# Patient Record
Sex: Male | Born: 1985 | Race: White | Hispanic: No | Marital: Single | Smoking: Never smoker
Health system: Southern US, Community
[De-identification: ages and names within clinical notes are randomized; demographics above are authoritative.]

## PROBLEM LIST (undated history)

## (undated) DIAGNOSIS — A0472 Enterocolitis due to Clostridium difficile, not specified as recurrent: Secondary | ICD-10-CM

---
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
## 2011-07-21 ENCOUNTER — Encounter (HOSPITAL_COMMUNITY): Payer: Self-pay | Admitting: *Deleted

## 2011-07-21 ENCOUNTER — Emergency Department (HOSPITAL_COMMUNITY)
Admission: EM | Admit: 2011-07-21 | Discharge: 2011-07-21 | Disposition: A | Discharge status: Home / Self Care | Payer: Worker's Compensation | Attending: Emergency Medicine | Admitting: Emergency Medicine

## 2011-07-21 DIAGNOSIS — Y990 Civilian activity done for income or pay: Secondary | ICD-10-CM | POA: Insufficient documentation

## 2011-07-21 DIAGNOSIS — S61509A Unspecified open wound of unspecified wrist, initial encounter: Secondary | ICD-10-CM | POA: Insufficient documentation

## 2011-07-21 DIAGNOSIS — W268XXA Contact with other sharp object(s), not elsewhere classified, initial encounter: Secondary | ICD-10-CM | POA: Insufficient documentation

## 2011-07-21 DIAGNOSIS — IMO0002 Reserved for concepts with insufficient information to code with codable children: Secondary | ICD-10-CM

## 2011-07-21 NOTE — ED Provider Notes (Signed)
History     CSN: [id number]  Arrival date & time 07/21/11  1440   First MD Initiated Contact with Patient 07/21/11 1456      Chief Complaint  Patient presents with  . Extremity Laceration    HPI Patient was at work today when he accidentally lacerated his left wrist with a sharp blade. Patient had significant bleeding after the injury. He had a pressure dressing applied which helped with the bleeding. Patient was transported by ambulance. He denies numbness or weakness. He denies any other injuries. History reviewed. No pertinent past medical history.  History reviewed. No pertinent past surgical history.  History reviewed. No pertinent family history.  History  Substance Use Topics  . Smoking status: Never Smoker   . Smokeless tobacco: Not on file  . Alcohol Use: No      Review of Systems  All other systems reviewed and are negative.    Allergies  Aspirin  Home Medications  No current outpatient prescriptions on file.  BP 131/79  Pulse 110  Temp(Src) 98.5 F (36.9 C) (Oral)  Resp 20  SpO2 96%  Physical Exam  Nursing note and vitals reviewed. Constitutional: He appears well-developed and well-nourished. No distress.  HENT:  Head: Normocephalic and atraumatic.  Right Ear: External ear normal.  Left Ear: External ear normal.  Eyes: Conjunctivae are normal. Right eye exhibits no discharge. Left eye exhibits no discharge. No scleral icterus.  Neck: Neck supple. No tracheal deviation present.  Cardiovascular: Normal rate.   Pulmonary/Chest: Effort normal. No stridor. No respiratory distress.  Musculoskeletal: He exhibits no edema.       Left forearm: He exhibits laceration.       Approximately 1 inch laceration left dorsal wrist proximal to the ulnar styloid, strong radial pulse, distal cap refill brisk, sensation light touch intact throughout all digits  Neurological: He is alert. Cranial nerve deficit: no gross deficits.  Skin: Skin is warm and dry. No rash  noted.  Psychiatric: He has a normal mood and affect.    ED Course  LACERATION REPAIR Performed by: Celene Kras Authorized by: Linwood Dibbles R Consent: Verbal consent obtained. Written consent not obtained. Risks and benefits: risks, benefits and alternatives were discussed Consent given by: patient Body area: upper extremity Location details: left lower arm Laceration length: 2.5 cm Tendon involvement: none Nerve involvement: none Vascular damage: no Anesthesia: local infiltration Local anesthetic: lidocaine 1% with epinephrine Anesthetic total: 10 ml Patient sedated: no Irrigation solution: saline Irrigation method: jet lavage Amount of cleaning: standard Debridement: none Degree of undermining: none Skin closure: 4-0 Prolene Number of sutures: 6 Technique: simple Approximation: close Approximation difficulty: simple Dressing: pressure dressing Patient tolerance: Patient tolerated the procedure well with no immediate complications. Comments: No arterial bleeding was noted. Patient did develop hematoma following the procedure. A pressure dressing was applied. It was explored and there did not appear to be any arterial injury. I suspect he did have a small venule laceration   (including critical care time)  Labs Reviewed - No data to display No results found.    MDM  Simple laceration that was repaired in the emergency department. Patient tolerated procedure well        Celene Kras, MD 07/21/11 1536

## 2011-07-21 NOTE — ED Notes (Signed)
Pt cut left wrist with blade while at work. approx one inch long. Area was wrapped when ems got there, bystanders stated it was aterial bleed. Vitals wnl.

## 2011-07-21 NOTE — Discharge Instructions (Signed)
Laceration Care, Adult     A laceration is a cut or lesion that goes through all layers of the skin and into the tissue just beneath the skin.  TREATMENT   Some lacerations may not require closure. Some lacerations may not be able to be closed due to an increased risk of infection. It is important to see your caregiver as soon as possible after an injury to minimize the risk of infection and maximize the opportunity for successful closure.  If closure is appropriate, pain medicines may be given, if needed. The wound will be cleaned to help prevent infection. Your caregiver will use stitches (sutures), staples, wound glue (adhesive), or skin adhesive strips to repair the laceration. These tools bring the skin edges together to allow for faster healing and a better cosmetic outcome. However, all wounds will heal with a scar. Once the wound has healed, scarring can be minimized by covering the wound with sunscreen during the day for 1 full year.  HOME CARE INSTRUCTIONS   For sutures or staples:  · Keep the wound clean and dry.   · If you were given a bandage (dressing), you should change it at least once a day. Also, change the dressing if it becomes wet or dirty, or as directed by your caregiver.   · Wash the wound with soap and water 2 times a day. Rinse the wound off with water to remove all soap. Pat the wound dry with a clean towel.   · After cleaning, apply a thin layer of the antibiotic ointment as recommended by your caregiver. This will help prevent infection and keep the dressing from sticking.   · You may shower as usual after the first 24 hours. Do not soak the wound in water until the sutures are removed.   · Only take over-the-counter or prescription medicines for pain, discomfort, or fever as directed by your caregiver.   · Get your sutures or staples removed as directed by your caregiver.   For skin adhesive strips:  · Keep the wound clean and dry.   · Do not get the skin adhesive strips wet. You may  bathe carefully, using caution to keep the wound dry.   · If the wound gets wet, pat it dry with a clean towel.   · Skin adhesive strips will fall off on their own. You may trim the strips as the wound heals. Do not remove skin adhesive strips that are still stuck to the wound. They will fall off in time.   For wound adhesive:  · You may briefly wet your wound in the shower or bath. Do not soak or scrub the wound. Do not swim. Avoid periods of heavy perspiration until the skin adhesive has fallen off on its own. After showering or bathing, gently pat the wound dry with a clean towel.   · Do not apply liquid medicine, cream medicine, or ointment medicine to your wound while the skin adhesive is in place. This may loosen the film before your wound is healed.   · If a dressing is placed over the wound, be careful not to apply tape directly over the skin adhesive. This may cause the adhesive to be pulled off before the wound is healed.   · Avoid prolonged exposure to sunlight or tanning lamps while the skin adhesive is in place. Exposure to ultraviolet light in the first year will darken the scar.   · The skin adhesive will usually remain in place for   5 to 10 days, then naturally fall off the skin. Do not pick at the adhesive film.   You may need a tetanus shot if:  · You cannot remember when you had your last tetanus shot.   · You have never had a tetanus shot.   If you get a tetanus shot, your arm may swell, get red, and feel warm to the touch. This is common and not a problem. If you need a tetanus shot and you choose not to have one, there is a rare chance of getting tetanus. Sickness from tetanus can be serious.  SEEK MEDICAL CARE IF:   · You have redness, swelling, or increasing pain in the wound.   · You see a red line that goes away from the wound.   · You have yellowish-white fluid (pus) coming from the wound.   · You have a fever.   · You notice a bad smell coming from the wound or dressing.   · Your wound  breaks open before or after sutures have been removed.   · You notice something coming out of the wound such as wood or glass.   · Your wound is on your hand or foot and you cannot move a finger or toe.   SEEK IMMEDIATE MEDICAL CARE IF:   · Your pain is not controlled with prescribed medicine.   · You have severe swelling around the wound causing pain and numbness or a change in color in your arm, hand, leg, or foot.   · Your wound splits open and starts bleeding.   · You have worsening numbness, weakness, or loss of function of any joint around or beyond the wound.   · You develop painful lumps near the wound or on the skin anywhere on your body.   MAKE SURE YOU:   · Understand these instructions.   · Will watch your condition.   · Will get help right away if you are not doing well or get worse.   Document Released: 05/07/2005 Document Revised: 01/17/2011 Document Reviewed: 10/31/2010  [employer]® Patient Information ©2012 [employer].

## 2012-03-09 ENCOUNTER — Emergency Department (HOSPITAL_COMMUNITY)
Admission: EM | Admit: 2012-03-09 | Discharge: 2012-03-09 | Disposition: A | Discharge status: Home / Self Care | Payer: 59 | Attending: Emergency Medicine | Admitting: Emergency Medicine

## 2012-03-09 ENCOUNTER — Encounter (HOSPITAL_COMMUNITY): Payer: Self-pay | Admitting: Emergency Medicine

## 2012-03-09 DIAGNOSIS — L03221 Cellulitis of neck: Secondary | ICD-10-CM

## 2012-03-09 DIAGNOSIS — L0211 Cutaneous abscess of neck: Secondary | ICD-10-CM | POA: Insufficient documentation

## 2012-03-09 MED ORDER — DOXYCYCLINE HYCLATE 100 MG PO TABS
200.0000 mg | ORAL_TABLET | Freq: Once | ORAL | Status: AC
Start: 2012-03-09 — End: 2012-03-09
  Administered 2012-03-09: 200 mg via ORAL
  Filled 2012-03-09: qty 2

## 2012-03-09 MED ORDER — DOXYCYCLINE HYCLATE 100 MG PO CAPS: 100.0000 mg | ORAL_CAPSULE | Freq: Two times a day (BID) | ORAL | Status: DC

## 2012-03-09 NOTE — ED Notes (Signed)
Patient c/o abscess to right side of neck.  Patient states noticed a few days ago that it looked like a pimple; today it began getting bigger.

## 2012-03-09 NOTE — ED Provider Notes (Signed)
History     CSN: [id number]  Arrival date & time 03/09/12  2105   First MD Initiated Contact with Patient 03/09/12 2113      Chief Complaint  Patient presents with  . Abscess    (Consider location/radiation/quality/duration/timing/severity/associated sxs/prior treatment) HPI Comments: States he had an ingrown hair that he picked at with a needle and then squeezed.  It is now red and swollen and was draining yest.  "now when i squeeze it i just get a clear yellowish liquid out of it".  Patient is a 26 y.o. male presenting with abscess. The history is provided by the patient. No language interpreter was used.  Abscess  This is a new problem. Episode onset: 2-3 days ago. The problem has been gradually worsening. The abscess is present on the neck. The problem is moderate. The abscess is characterized by redness and painfulness. Pertinent negatives include no fever. There were no sick contacts. He has received no recent medical care.    History reviewed. No pertinent past medical history.  History reviewed. No pertinent past surgical history.  No family history on file.  History  Substance Use Topics  . Smoking status: Never Smoker   . Smokeless tobacco: Not on file  . Alcohol Use: No      Review of Systems  Constitutional: Negative for fever and chills.  Skin:       Abscess   Neurological: Negative for weakness and numbness.  All other systems reviewed and are negative.    Allergies  Aspirin  Home Medications   Current Outpatient Rx  Name Route Sig Dispense Refill  . DOXYCYCLINE HYCLATE 100 MG PO CAPS Oral Take 1 capsule (100 mg total) by mouth 2 (two) times daily. 20 capsule 0    BP 131/70  Pulse 116  Temp 100.4 F (38 C) (Oral)  Resp 18  Ht 5\' 10"  (1.778 m)  Wt 198 lb (89.812 kg)  BMI 28.41 kg/m2  SpO2 99%  Physical Exam  Nursing note and vitals reviewed. Constitutional: He is oriented to person, place, and time. He appears well-developed and  well-nourished.  HENT:  Head: Normocephalic and atraumatic.  Eyes: EOM are normal.  Neck: Trachea normal. Muscular tenderness present. No spinous process tenderness present. Erythema and decreased range of motion present.    Cardiovascular: Normal rate, regular rhythm and intact distal pulses.   Pulmonary/Chest: Effort normal. No respiratory distress.  Abdominal: Soft. He exhibits no distension. There is no tenderness.  Neurological: He is alert and oriented to person, place, and time.  Skin: Skin is warm and dry.  Psychiatric: He has a normal mood and affect. Judgment normal.    ED Course  Procedures (including critical care time)  Labs Reviewed - No data to display No results found.   1. Cellulitis and abscess of neck       MDM  rx-doxycycline, 20        Evalina Field, [place] 03/15/12 [phone]

## 2012-03-17 NOTE — ED Provider Notes (Signed)
Medical screening examination/treatment/procedure(s) were performed by non-physician practitioner and as supervising physician I was immediately available for consultation/collaboration.   Carleene Cooper III, MD 03/17/12 [phone]

## 2015-09-03 ENCOUNTER — Emergency Department (HOSPITAL_COMMUNITY): Payer: Commercial Managed Care - PPO | Admitting: Anesthesiology

## 2015-09-03 ENCOUNTER — Emergency Department (HOSPITAL_COMMUNITY): Payer: Commercial Managed Care - PPO

## 2015-09-03 ENCOUNTER — Observation Stay (HOSPITAL_COMMUNITY)
Admission: EM | Admit: 2015-09-03 | Discharge: 2015-09-04 | Disposition: A | Discharge status: Home / Self Care | Payer: Commercial Managed Care - PPO | Attending: General Surgery | Admitting: General Surgery

## 2015-09-03 ENCOUNTER — Encounter (HOSPITAL_COMMUNITY)
Admission: EM | Disposition: A | Discharge status: Home / Self Care | Payer: Self-pay | Source: Home / Self Care | Attending: Emergency Medicine

## 2015-09-03 ENCOUNTER — Encounter (HOSPITAL_COMMUNITY): Payer: Self-pay | Admitting: Emergency Medicine

## 2015-09-03 DIAGNOSIS — K3580 Unspecified acute appendicitis: Secondary | ICD-10-CM | POA: Diagnosis not present

## 2015-09-03 DIAGNOSIS — K37 Unspecified appendicitis: Secondary | ICD-10-CM | POA: Diagnosis present

## 2015-09-03 HISTORY — PX: LAPAROSCOPIC APPENDECTOMY: SHX408

## 2015-09-03 LAB — COMPREHENSIVE METABOLIC PANEL
ALBUMIN: 4.4 g/dL (ref 3.5–5.0)
ALK PHOS: 93 U/L (ref 38–126)
ALT: 75 U/L — ABNORMAL HIGH (ref 17–63)
ANION GAP: 11 (ref 5–15)
AST: 53 U/L — AB (ref 15–41)
BUN: 12 mg/dL (ref 6–20)
CO2: 22 mmol/L (ref 22–32)
Calcium: 9.3 mg/dL (ref 8.9–10.3)
Chloride: 101 mmol/L (ref 101–111)
Creatinine, Ser: 0.89 mg/dL (ref 0.61–1.24)
GFR calc Af Amer: >60 mL/min (ref >60)
GFR calc non Af Amer: >60 mL/min (ref >60)
GLUCOSE: 122 mg/dL — AB (ref 65–99)
POTASSIUM: 4.6 mmol/L (ref 3.5–5.1)
SODIUM: 134 mmol/L — AB (ref 135–145)
Total Bilirubin: 1.7 mg/dL — ABNORMAL HIGH (ref 0.3–1.2)
Total Protein: 8.1 g/dL (ref 6.5–8.1)

## 2015-09-03 LAB — CBC
HEMATOCRIT: 45.6 % (ref 39.0–52.0)
HEMOGLOBIN: 15.7 g/dL (ref 13.0–17.0)
MCH: 30.3 pg (ref 26.0–34.0)
MCHC: 34.4 g/dL (ref 30.0–36.0)
MCV: 88 fL (ref 78.0–100.0)
Platelets: 334 10*3/uL (ref 150–400)
RBC: 5.18 MIL/uL (ref 4.22–5.81)
RDW: 11.5 % (ref 11.5–15.5)
WBC: 17.5 10*3/uL — ABNORMAL HIGH (ref 4.0–10.5)

## 2015-09-03 LAB — URINALYSIS, ROUTINE W REFLEX MICROSCOPIC
Bilirubin Urine: NEGATIVE
Glucose, UA: NEGATIVE mg/dL
Hgb urine dipstick: NEGATIVE
Ketones, ur: NEGATIVE mg/dL
Leukocytes, UA: NEGATIVE
Nitrite: NEGATIVE
PH: 8 (ref 5.0–8.0)
Protein, ur: 30 mg/dL — AB
SPECIFIC GRAVITY, URINE: 1.027 (ref 1.005–1.030)

## 2015-09-03 LAB — URINE MICROSCOPIC-ADD ON

## 2015-09-03 SURGERY — APPENDECTOMY, LAPAROSCOPIC
Anesthesia: General | Site: Abdomen

## 2015-09-03 MED ORDER — SUGAMMADEX SODIUM 200 MG/2ML IV SOLN
INTRAVENOUS | Status: AC
  Filled 2015-09-03: qty 2

## 2015-09-03 MED ORDER — SODIUM CHLORIDE 0.9 % IV SOLN: INTRAVENOUS | Status: DC

## 2015-09-03 MED ORDER — IOPAMIDOL (ISOVUE-300) INJECTION 61%
INTRAVENOUS | Status: AC
  Administered 2015-09-03: 100 mL
  Filled 2015-09-03: qty 100

## 2015-09-03 MED ORDER — SUCCINYLCHOLINE CHLORIDE 20 MG/ML IJ SOLN
INTRAMUSCULAR | Status: DC | PRN
  Administered 2015-09-03: 160 mg via INTRAVENOUS

## 2015-09-03 MED ORDER — PROPOFOL 10 MG/ML IV BOLUS
INTRAVENOUS | Status: AC
  Filled 2015-09-03: qty 20

## 2015-09-03 MED ORDER — SUGAMMADEX SODIUM 200 MG/2ML IV SOLN
INTRAVENOUS | Status: DC | PRN
  Administered 2015-09-03: 200 mg via INTRAVENOUS

## 2015-09-03 MED ORDER — HYDROMORPHONE HCL 1 MG/ML IJ SOLN
0.2500 mg | INTRAMUSCULAR | Status: DC | PRN
  Administered 2015-09-03 (×2): 0.5 mg via INTRAVENOUS

## 2015-09-03 MED ORDER — PROPOFOL 10 MG/ML IV BOLUS
INTRAVENOUS | Status: DC | PRN
  Administered 2015-09-03: 200 mg via INTRAVENOUS

## 2015-09-03 MED ORDER — FENTANYL CITRATE (PF) 100 MCG/2ML IJ SOLN
INTRAMUSCULAR | Status: DC | PRN
  Administered 2015-09-03: 50 ug via INTRAVENOUS
  Administered 2015-09-03: 150 ug via INTRAVENOUS
  Administered 2015-09-03: 50 ug via INTRAVENOUS

## 2015-09-03 MED ORDER — MIDAZOLAM HCL 2 MG/2ML IJ SOLN: 0.5000 mg | Freq: Once | INTRAMUSCULAR | Status: DC | PRN

## 2015-09-03 MED ORDER — LACTATED RINGERS IV SOLN
INTRAVENOUS | Status: DC | PRN
  Administered 2015-09-03 (×2): via INTRAVENOUS

## 2015-09-03 MED ORDER — FENTANYL CITRATE (PF) 250 MCG/5ML IJ SOLN
INTRAMUSCULAR | Status: AC
  Filled 2015-09-03: qty 5

## 2015-09-03 MED ORDER — ONDANSETRON HCL 4 MG/2ML IJ SOLN
INTRAMUSCULAR | Status: AC
  Filled 2015-09-03: qty 2

## 2015-09-03 MED ORDER — METRONIDAZOLE IN NACL 5-0.79 MG/ML-% IV SOLN
500.0000 mg | Freq: Three times a day (TID) | INTRAVENOUS | Status: DC
  Administered 2015-09-04: 500 mg via INTRAVENOUS
  Filled 2015-09-03 (×3): qty 100

## 2015-09-03 MED ORDER — DEXTROSE 5 % IV SOLN
2.0000 g | INTRAVENOUS | Status: DC
  Filled 2015-09-03: qty 2

## 2015-09-03 MED ORDER — SODIUM CHLORIDE 0.9 % IV BOLUS (SEPSIS)
500.0000 mL | Freq: Once | INTRAVENOUS | Status: AC
  Administered 2015-09-03: 500 mL via INTRAVENOUS

## 2015-09-03 MED ORDER — DEXTROSE 5 % IV SOLN
2.0000 g | Freq: Once | INTRAVENOUS | Status: AC
  Administered 2015-09-03: 2 g via INTRAVENOUS
  Filled 2015-09-03: qty 2

## 2015-09-03 MED ORDER — ONDANSETRON HCL 4 MG/2ML IJ SOLN
INTRAMUSCULAR | Status: DC | PRN
  Administered 2015-09-03: 4 mg via INTRAVENOUS

## 2015-09-03 MED ORDER — ROCURONIUM BROMIDE 100 MG/10ML IV SOLN
INTRAVENOUS | Status: DC | PRN
  Administered 2015-09-03: 20 mg via INTRAVENOUS
  Administered 2015-09-03: 30 mg via INTRAVENOUS

## 2015-09-03 MED ORDER — MIDAZOLAM HCL 5 MG/5ML IJ SOLN
INTRAMUSCULAR | Status: DC | PRN
  Administered 2015-09-03: 2 mg via INTRAVENOUS

## 2015-09-03 MED ORDER — HYDROMORPHONE HCL 1 MG/ML IJ SOLN
INTRAMUSCULAR | Status: AC
  Filled 2015-09-03: qty 1

## 2015-09-03 MED ORDER — METRONIDAZOLE IN NACL 5-0.79 MG/ML-% IV SOLN
500.0000 mg | Freq: Once | INTRAVENOUS | Status: AC
  Administered 2015-09-03: 500 mg via INTRAVENOUS
  Filled 2015-09-03: qty 100

## 2015-09-03 MED ORDER — MEPERIDINE HCL 25 MG/ML IJ SOLN: 6.2500 mg | INTRAMUSCULAR | Status: DC | PRN

## 2015-09-03 MED ORDER — SODIUM CHLORIDE 0.9 % IR SOLN
Status: DC | PRN
  Administered 2015-09-03: 1000 mL

## 2015-09-03 MED ORDER — BUPIVACAINE-EPINEPHRINE (PF) 0.25% -1:200000 IJ SOLN
INTRAMUSCULAR | Status: AC
  Filled 2015-09-03: qty 30

## 2015-09-03 MED ORDER — ENOXAPARIN SODIUM 40 MG/0.4ML ~~LOC~~ SOLN: 40.0000 mg | SUBCUTANEOUS | Status: DC

## 2015-09-03 MED ORDER — MIDAZOLAM HCL 2 MG/2ML IJ SOLN
INTRAMUSCULAR | Status: AC
  Filled 2015-09-03: qty 2

## 2015-09-03 MED ORDER — LIDOCAINE HCL (CARDIAC) 20 MG/ML IV SOLN
INTRAVENOUS | Status: DC | PRN
  Administered 2015-09-03: 30 mg via INTRAVENOUS

## 2015-09-03 MED ORDER — BUPIVACAINE-EPINEPHRINE 0.25% -1:200000 IJ SOLN
INTRAMUSCULAR | Status: DC | PRN
  Administered 2015-09-03: 10.5 mL

## 2015-09-03 MED ORDER — HYDROMORPHONE HCL 1 MG/ML IJ SOLN
INTRAMUSCULAR | Status: DC | PRN
  Administered 2015-09-03: 1 mg via INTRAVENOUS

## 2015-09-03 SURGICAL SUPPLY — 44 items
ADH SKN CLS LQ APL DERMABOND (GAUZE/BANDAGES/DRESSINGS) ×1
APPLIER CLIP ROT 10 11.4 M/L (STAPLE)
APR CLP MED LRG 11.4X10 (STAPLE)
BAG SPEC RTRVL 10 TROC 200 (ENDOMECHANICALS) ×1
CANISTER SUCTION 2500CC (MISCELLANEOUS) ×3 IMPLANT
CHLORAPREP W/TINT 26ML (MISCELLANEOUS) ×3 IMPLANT
CLIP APPLIE ROT 10 11.4 M/L (STAPLE) IMPLANT
CLOSURE WOUND 1/2 X4 (GAUZE/BANDAGES/DRESSINGS) ×1
COVER SURGICAL LIGHT HANDLE (MISCELLANEOUS) ×3 IMPLANT
CUTTER FLEX LINEAR 45M (STAPLE) ×3 IMPLANT
DERMABOND ADHESIVE PROPEN (GAUZE/BANDAGES/DRESSINGS) ×2
DERMABOND ADVANCED .7 DNX6 (GAUZE/BANDAGES/DRESSINGS) IMPLANT
DEVICE TROCAR PUNCTURE CLOSURE (ENDOMECHANICALS) ×3 IMPLANT
ELECT REM PT RETURN 9FT ADLT (ELECTROSURGICAL) ×3
ELECTRODE REM PT RTRN 9FT ADLT (ELECTROSURGICAL) ×1 IMPLANT
GLOVE BIO SURGEON STRL SZ7 (GLOVE) ×3 IMPLANT
GLOVE BIO SURGEON STRL SZ8 (GLOVE) ×2 IMPLANT
GLOVE BIOGEL PI IND STRL 7.5 (GLOVE) ×1 IMPLANT
GLOVE BIOGEL PI INDICATOR 7.5 (GLOVE) ×2
GOWN STRL REUS W/ TWL LRG LVL3 (GOWN DISPOSABLE) ×3 IMPLANT
GOWN STRL REUS W/TWL LRG LVL3 (GOWN DISPOSABLE) ×9
KIT BASIN OR (CUSTOM PROCEDURE TRAY) ×3 IMPLANT
KIT ROOM TURNOVER OR (KITS) ×3 IMPLANT
LIQUID BAND (GAUZE/BANDAGES/DRESSINGS) ×3 IMPLANT
NS IRRIG 1000ML POUR BTL (IV SOLUTION) ×3 IMPLANT
PAD ARMBOARD 7.5X6 YLW CONV (MISCELLANEOUS) ×6 IMPLANT
POUCH RETRIEVAL ECOSAC 10 (ENDOMECHANICALS) ×1 IMPLANT
POUCH RETRIEVAL ECOSAC 10MM (ENDOMECHANICALS) ×2
RELOAD STAPLE TA45 3.5 REG BLU (ENDOMECHANICALS) ×6 IMPLANT
SCALPEL HARMONIC ACE (MISCELLANEOUS) ×3 IMPLANT
SCISSORS LAP 5X35 DISP (ENDOMECHANICALS) IMPLANT
SET IRRIG TUBING LAPAROSCOPIC (IRRIGATION / IRRIGATOR) ×3 IMPLANT
SLEEVE ENDOPATH XCEL 5M (ENDOMECHANICALS) ×3 IMPLANT
SPECIMEN JAR SMALL (MISCELLANEOUS) ×3 IMPLANT
STRIP CLOSURE SKIN 1/2X4 (GAUZE/BANDAGES/DRESSINGS) ×2 IMPLANT
SUT MNCRL AB 4-0 PS2 18 (SUTURE) ×3 IMPLANT
SUT VICRYL 0 UR6 27IN ABS (SUTURE) ×5 IMPLANT
TOWEL OR 17X24 6PK STRL BLUE (TOWEL DISPOSABLE) ×3 IMPLANT
TOWEL OR 17X26 10 PK STRL BLUE (TOWEL DISPOSABLE) ×3 IMPLANT
TRAY FOLEY CATH 16FR SILVER (SET/KITS/TRAYS/PACK) ×3 IMPLANT
TRAY LAPAROSCOPIC MC (CUSTOM PROCEDURE TRAY) ×3 IMPLANT
TROCAR XCEL BLUNT TIP 100MML (ENDOMECHANICALS) ×3 IMPLANT
TROCAR XCEL NON-BLD 5MMX100MML (ENDOMECHANICALS) ×3 IMPLANT
TUBING INSUFFLATION (TUBING) ×3 IMPLANT

## 2015-09-03 NOTE — ED Notes (Addendum)
Pt reports lower R sided abdominal pain radiating downward, accompanied by nausea, starting this morning. Pt states pain in worse with movement, at rest it is better. Pt denies urinary symptoms.

## 2015-09-03 NOTE — Transfer of Care (Signed)
Immediate Anesthesia Transfer of Care Note  Patient: Jesse Swanson  Procedure(s) Performed: Procedure(s): APPENDECTOMY LAPAROSCOPIC (N/A)  Patient Location: PACU  Anesthesia Type:General  Level of Consciousness: awake, alert  and oriented  Airway & Oxygen Therapy: Patient Spontanous Breathing  Post-op Assessment: Report given to RN and Post -op Vital signs reviewed and stable  Post vital signs: Reviewed and stable  Last Vitals:  Filed Vitals:   09/03/15 1954 09/03/15 2137  BP: 130/82 124/58  Pulse: 86 93  Temp: 36.8 C 37.1 C  Resp:  16    Complications: No apparent anesthesia complications

## 2015-09-03 NOTE — ED Notes (Signed)
Pt states he started having right lower abd pain this morning. Pt states he is nauseous but has not vomited. Denies urinary symptoms. Still has appendix.

## 2015-09-03 NOTE — H&P (Signed)
Jesse Swanson is an 30 y.o. male.   Chief Complaint: rlq pain HPI: 11 yom who presents with less than 24 hours of rlq pain, getting worse not going away. Some nausea, no emesis, had bm today, not eating much.  No fevers.  Underwent eval and ct scan that appears to be appendicitis.  History reviewed. No pertinent past medical history.  History reviewed. No pertinent past surgical history.  History reviewed. No pertinent family history. Social History:  reports that he has never smoked. He does not have any smokeless tobacco history on file. He reports that he drinks alcohol. He reports that he does not use illicit drugs.  Allergies:  Allergies  Allergen Reactions  . Aspirin Swelling    meds none  Results for orders placed or performed during the hospital encounter of 09/03/15 (from the past 48 hour(s))  Comprehensive metabolic panel     Status: Abnormal   Collection Time: 09/03/15  4:42 PM  Result Value Ref Range   Sodium 134 (L) 135 - 145 mmol/L   Potassium 4.6 3.5 - 5.1 mmol/L   Chloride 101 101 - 111 mmol/L   CO2 22 22 - 32 mmol/L   Glucose, Bld 122 (H) 65 - 99 mg/dL   BUN 12 6 - 20 mg/dL   Creatinine, Ser 0.89 0.61 - 1.24 mg/dL   Calcium 9.3 8.9 - 10.3 mg/dL   Total Protein 8.1 6.5 - 8.1 g/dL   Albumin 4.4 3.5 - 5.0 g/dL   AST 53 (H) 15 - 41 U/L   ALT 75 (H) 17 - 63 U/L   Alkaline Phosphatase 93 38 - 126 U/L   Total Bilirubin 1.7 (H) 0.3 - 1.2 mg/dL   GFR calc non Af Amer >60 >60 mL/min   GFR calc Af Amer >60 >60 mL/min    Comment: (NOTE) The eGFR has been calculated using the CKD EPI equation. This calculation has not been validated in all clinical situations. eGFR's persistently <60 mL/min signify possible Chronic Kidney Disease.    Anion gap 11 5 - 15  CBC     Status: Abnormal   Collection Time: 09/03/15  4:42 PM  Result Value Ref Range   WBC 17.5 (H) 4.0 - 10.5 K/uL   RBC 5.18 4.22 - 5.81 MIL/uL   Hemoglobin 15.7 13.0 - 17.0 g/dL   HCT 45.6 39.0 - 52.0 %    MCV 88.0 78.0 - 100.0 fL   MCH 30.3 26.0 - 34.0 pg   MCHC 34.4 30.0 - 36.0 g/dL   RDW 11.5 11.5 - 15.5 %   Platelets 334 150 - 400 K/uL  Urinalysis, Routine w reflex microscopic (not at [facility])     Status: Abnormal   Collection Time: 09/03/15  5:32 PM  Result Value Ref Range   Color, Urine YELLOW YELLOW   APPearance TURBID (A) CLEAR   Specific Gravity, Urine 1.027 1.005 - 1.030   pH 8.0 5.0 - 8.0   Glucose, UA NEGATIVE NEGATIVE mg/dL   Hgb urine dipstick NEGATIVE NEGATIVE   Bilirubin Urine NEGATIVE NEGATIVE   Ketones, ur NEGATIVE NEGATIVE mg/dL   Protein, ur 30 (A) NEGATIVE mg/dL   Nitrite NEGATIVE NEGATIVE   Leukocytes, UA NEGATIVE NEGATIVE  Urine microscopic-add on     Status: Abnormal   Collection Time: 09/03/15  5:32 PM  Result Value Ref Range   Squamous Epithelial / LPF 0-5 (A) NONE SEEN   WBC, UA 0-5 0 - 5 WBC/hpf   RBC / HPF 0-5 0 -  5 RBC/hpf   Bacteria, UA RARE (A) NONE SEEN   Urine-Other AMORPHOUS URATES/PHOSPHATES    Ct Abdomen Pelvis W Contrast  09/03/2015  CLINICAL DATA:  Patient with right lower quadrant abdominal pain and nausea. EXAM: CT ABDOMEN AND PELVIS WITH CONTRAST TECHNIQUE: Multidetector CT imaging of the abdomen and pelvis was performed using the standard protocol following bolus administration of intravenous contrast. CONTRAST:  189m ISOVUE-300 IOPAMIDOL (ISOVUE-300) INJECTION 61% COMPARISON:  None. FINDINGS: Lower chest: Heart is normal in size. No consolidative pulmonary opacities. No pleural effusion. Hepatobiliary: Liver is normal in size and contour. No focal hepatic lesions identified. Gallbladder is unremarkable. No intrahepatic or extrahepatic biliary ductal dilatation. Pancreas: Unremarkable Spleen: Unremarkable Adrenals/Urinary Tract: The adrenal glands are normal. Kidneys enhance symmetrically with contrast. No hydronephrosis. Urinary bladder is unremarkable. Stomach/Bowel: The appendix is dilated measuring up to 14 mm (image 90; series 5). There is  periappendiceal fat stranding. No evidence for perforation or surrounding abscess formation. No free fluid or free intraperitoneal air. No evidence for bowel obstruction. Normal morphology to the stomach. Vascular/Lymphatic: Normal caliber abdominal aorta. No retroperitoneal adenopathy. Other: Prostate unremarkable. Fat within the bilateral inguinal canals. Musculoskeletal: No aggressive or acute appearing osseous lesions. IMPRESSION: Findings compatible with acute appendicitis. No evidence for perforation or abscess formation. Electronically Signed   By: DLovey NewcomerM.D.   On: 09/03/2015 19:17    Review of Systems  Constitutional: Negative for fever and chills.  Respiratory: Negative for cough.   Cardiovascular: Negative for chest pain.  Gastrointestinal: Positive for nausea and abdominal pain. Negative for vomiting, diarrhea and constipation.  Genitourinary: Negative for dysuria, urgency and frequency.    Blood pressure 130/82, pulse 86, temperature 98.2 F (36.8 C), temperature source Oral, resp. rate 15, height '5\' 9"'$  (1.753 m), weight 114.896 kg (253 lb 4.8 oz), SpO2 99 %. Physical Exam  Vitals reviewed. Constitutional: He appears well-developed and well-nourished.  Eyes: No scleral icterus.  Neck: Neck supple.  Cardiovascular: Normal rate, regular rhythm and normal heart sounds.   Respiratory: Effort normal and breath sounds normal. He has no wheezes. He has no rales.  GI: Soft. Bowel sounds are normal. He exhibits no distension. There is tenderness in the right lower quadrant.     Assessment/Plan Appendicitis Abx, lap appy tonight, risks and procedure discussed with patient  WRolm Bookbinder MD 09/03/2015, 9:22 PM

## 2015-09-03 NOTE — Op Note (Signed)
Preoperative diagnosis: acute appendicitis Postoperative diagnosis: same as above Procedure: laparoscopic appendectomy Surgeon: Dr Harden MoMatt Tanyon Alipio Anesthesia: general EBL: minimal Drains none Specimen appendix to pathology Complications: none Sponge count correct at completion Disposition to recovery stable  Indications: This is a [age] yom with acute appendicitis. We discussed laparoscopic appendectomy.   Procedure: After informed consent was obtained the patient was taken to the operating room. He was already given antibiotics. Sequential compression devices were on his legs. He was placed under general anesthesia without complication. His abdomen was prepped and draped in the standard sterile surgical fashion. A surgical timeout was then performed. A foley catheter was placed.   I infiltrated marcaine below the umbilicus. I made an incision and then entered the fascia sharply. I then entered the peritoneum bluntly. I placed a 0 vicryl pursestring suture and inserted a hasson trocar.I then inserted 2 further 5 mm trocars in the suprapubic region and the left mid abdomen. He did appear to have acute appendicitis. I saw the TI into the right colon. I then dissected it free from the cecum. I divided the mesoappendix with the harmonic scalpel. I then divided the appendix with the gia stapler with two loads at the cecum due to inflammation. I then placed this in a bag and removed it from the abdomen.  I then obtained hemostasis and irrigated. I then removed the umbilical trocar and tied down my pursetring. I placed another 2 0 vicryl sutures with the endoclose device to obliterate this defect.  I then desufflated the abdomen and removed all my remaining trocars. I then closed these with 4-0 Monocryl and Dermabond. He tolerated this well was extubated and transferred to the recovery room in stable condition

## 2015-09-03 NOTE — ED Provider Notes (Signed)
CSN: [id number]     Arrival date & time 09/03/15  1602 History   First MD Initiated Contact with Patient 09/03/15 1659     Chief Complaint  Patient presents with  . Abdominal Pain     (Consider location/radiation/quality/duration/timing/severity/associated sxs/prior Treatment) The history is provided by the patient.     Patient presents with RLQ abdominal pain, nausea, decreased appetite that began this morning.  The pain is constant, worse with movement or lying.  He thought he might be constipated, last BM 2 days ago, took laxative this morning and had loose bowel movement. Denies fevers, chills, myalgias, vomiting, urinary symptoms, penile discharge, testicular pain or swelling.  No prior abdominal surgeries.  Last ate food around 11am, has been drinking gatorade in the room - has had approximately 10oz.    History reviewed. No pertinent past medical history. History reviewed. No pertinent past surgical history. History reviewed. No pertinent family history. Social History  Substance Use Topics  . Smoking status: Never Smoker   . Smokeless tobacco: None  . Alcohol Use: Yes    Review of Systems  All other systems reviewed and are negative.     Allergies  Aspirin  Home Medications   Prior to Admission medications   Medication Sig Start Date End Date Taking? Authorizing Provider  doxycycline (VIBRAMYCIN) 100 MG capsule Take 1 capsule (100 mg total) by mouth 2 (two) times daily. 03/09/12   Richard Paul Half, PA-C   BP 136/89 mmHg  Temp(Src) 98.1 F (36.7 C) (Oral)  Resp 15  Ht  (1.753 m)  Wt 114.896 kg  BMI 37.39 kg/m2  SpO2 97% Physical Exam  Constitutional: He appears well-developed and well-nourished. No distress.  HENT:  Head: Normocephalic and atraumatic.  Neck: Neck supple.  Cardiovascular: Normal rate and regular rhythm.   Pulmonary/Chest: Effort normal and breath sounds normal. No respiratory distress. He has no wheezes. He has no rales.  Abdominal:  Soft. Bowel sounds are normal. He exhibits no distension and no mass. There is tenderness in the right lower quadrant. There is tenderness at McBurney's point. There is no rebound, no guarding and no CVA tenderness.  Neurological: He is alert. He exhibits normal muscle tone.  Skin: He is not diaphoretic.  Nursing note and vitals reviewed.   ED Course  Procedures (including critical care time) Labs Review Labs Reviewed  COMPREHENSIVE METABOLIC PANEL - Abnormal; Notable for the following:    Sodium 134 (*)    Glucose, Bld 122 (*)    AST 53 (*)    ALT 75 (*)    Total Bilirubin 1.7 (*)    All other components within normal limits  CBC - Abnormal; Notable for the following:    WBC 17.5 (*)    All other components within normal limits  URINALYSIS, ROUTINE W REFLEX MICROSCOPIC (NOT AT [facility]) - Abnormal; Notable for the following:    APPearance TURBID (*)    Protein, ur 30 (*)    All other components within normal limits  URINE MICROSCOPIC-ADD ON - Abnormal; Notable for the following:    Squamous Epithelial / LPF 0-5 (*)    Bacteria, UA RARE (*)    All other components within normal limits    Imaging Review Ct Abdomen Pelvis W Contrast  09/03/2015  CLINICAL DATA:  Patient with right lower quadrant abdominal pain and nausea. EXAM: CT ABDOMEN AND PELVIS WITH CONTRAST TECHNIQUE: Multidetector CT imaging of the abdomen and pelvis was performed using the standard protocol following bolus  administration of intravenous contrast. CONTRAST:  100mL ISOVUE-300 IOPAMIDOL (ISOVUE-300) INJECTION 61% COMPARISON:  None. FINDINGS: Lower chest: Heart is normal in size. No consolidative pulmonary opacities. No pleural effusion. Hepatobiliary: Liver is normal in size and contour. No focal hepatic lesions identified. Gallbladder is unremarkable. No intrahepatic or extrahepatic biliary ductal dilatation. Pancreas: Unremarkable Spleen: Unremarkable Adrenals/Urinary Tract: The adrenal glands are normal. Kidneys  enhance symmetrically with contrast. No hydronephrosis. Urinary bladder is unremarkable. Stomach/Bowel: The appendix is dilated measuring up to 14 mm (image 90; series 5). There is periappendiceal fat stranding. No evidence for perforation or surrounding abscess formation. No free fluid or free intraperitoneal air. No evidence for bowel obstruction. Normal morphology to the stomach. Vascular/Lymphatic: Normal caliber abdominal aorta. No retroperitoneal adenopathy. Other: Prostate unremarkable. Fat within the bilateral inguinal canals. Musculoskeletal: No aggressive or acute appearing osseous lesions. IMPRESSION: Findings compatible with acute appendicitis. No evidence for perforation or abscess formation. Electronically Signed   By: Annia Beltrew  Davis M.D.   On: 09/03/2015 19:17   I have personally reviewed and evaluated these images and lab results as part of my medical decision-making.   EKG Interpretation None      MDM   Final diagnoses:  Acute appendicitis, unspecified acute appendicitis type    Afebrile nontoxic patient with focal RLQ abdominal pain with decreased appetite, nausea that began this morning.  Labs significant for leukocytosis (17.5), slight elevation in LFTs, hyperglycemia.  CT abd/pelvis demonstrates uncomplicated appendicitis.  Discussed pt with Dr Dwain SarnaWakefield who saw pt in ED and admitted him.  Pt went directly to OR.      Trixie Dredgemily Tyesha Joffe, PA-C 09/03/15 2222  Alvira MondayErin Schlossman, MD 09/06/15 [phone]

## 2015-09-03 NOTE — ED Notes (Signed)
Informed consent signed and at bedside  

## 2015-09-03 NOTE — Anesthesia Procedure Notes (Signed)
Procedure Name: Intubation Date/Time: 09/03/2015 10:13 PM Performed by: Arlice ColtMANESS, Lakelyn Straus B Pre-anesthesia Checklist: Patient identified, Emergency Drugs available, Suction available, Patient being monitored and Timeout performed Patient Re-evaluated:Patient Re-evaluated prior to inductionOxygen Delivery Method: Circle system utilized Preoxygenation: Pre-oxygenation with 100% oxygen Intubation Type: IV induction and Rapid sequence Laryngoscope Size: Mac and 3 Grade View: Grade II Tube type: Oral Tube size: 7.5 mm Number of attempts: 1 Airway Equipment and Method: Stylet Placement Confirmation: ETT inserted through vocal cords under direct vision,  positive ETCO2 and breath sounds checked- equal and bilateral Secured at: 22 cm Tube secured with: Tape Dental Injury: Teeth and Oropharynx as per pre-operative assessment

## 2015-09-03 NOTE — Anesthesia Preprocedure Evaluation (Addendum)
Anesthesia Evaluation  Patient identified by MRN, date of birth, ID band Patient awake    Reviewed: Allergy & Precautions, NPO status , Patient's Chart, lab work & pertinent test results  History of Anesthesia Complications Negative for: history of anesthetic complications  Airway Mallampati: II  TM Distance: >3 FB Neck ROM: Full    Dental  (+) Dental Advisory Given, Teeth Intact   Pulmonary Recent URI , Resolved,    breath sounds clear to auscultation       Cardiovascular negative cardio ROS   Rhythm:Regular Rate:Normal     Neuro/Psych negative neurological ROS     GI/Hepatic Elevated LFTs Nausea with acute appy   Endo/Other  Morbid obesity  Renal/GU negative Renal ROS     Musculoskeletal   Abdominal (+) + obese,   Peds  Hematology negative hematology ROS (+)   Anesthesia Other Findings   Reproductive/Obstetrics                            Anesthesia Physical Anesthesia Plan  ASA: II and emergent  Anesthesia Plan: General   Post-op Pain Management:    Induction: Intravenous and Rapid sequence  Airway Management Planned: Oral ETT  Additional Equipment:   Intra-op Plan:   Post-operative Plan: Extubation in OR  Informed Consent: I have reviewed the patients History and Physical, chart, labs and discussed the procedure including the risks, benefits and alternatives for the proposed anesthesia with the patient or authorized representative who has indicated his/her understanding and acceptance.   Dental advisory given  Plan Discussed with: CRNA and Surgeon  Anesthesia Plan Comments: (Plan routine monitors, GETA)        Anesthesia Quick Evaluation

## 2015-09-04 MED ORDER — ACETAMINOPHEN 500 MG PO TABS
1000.0000 mg | ORAL_TABLET | Freq: Four times a day (QID) | ORAL | Status: DC
  Administered 2015-09-04 (×2): 1000 mg via ORAL
  Filled 2015-09-04 (×2): qty 2

## 2015-09-04 MED ORDER — ONDANSETRON HCL 4 MG/2ML IJ SOLN: 4.0000 mg | Freq: Four times a day (QID) | INTRAMUSCULAR | Status: DC | PRN

## 2015-09-04 MED ORDER — ONDANSETRON 4 MG PO TBDP
4.0000 mg | ORAL_TABLET | Freq: Four times a day (QID) | ORAL | Status: DC | PRN
Start: 2015-09-04 — End: 2015-09-04

## 2015-09-04 MED ORDER — METHOCARBAMOL 500 MG PO TABS
500.0000 mg | ORAL_TABLET | Freq: Four times a day (QID) | ORAL | Status: DC | PRN
  Administered 2015-09-04: 500 mg via ORAL
  Filled 2015-09-04: qty 1

## 2015-09-04 MED ORDER — MORPHINE SULFATE (PF) 2 MG/ML IV SOLN
2.0000 mg | INTRAVENOUS | Status: DC | PRN
  Administered 2015-09-04: 2 mg via INTRAVENOUS
  Filled 2015-09-04: qty 1

## 2015-09-04 MED ORDER — OXYCODONE HCL 5 MG PO TABS: 5.0000 mg | ORAL_TABLET | ORAL | Status: AC | PRN

## 2015-09-04 MED ORDER — OXYCODONE HCL 5 MG PO TABS
5.0000 mg | ORAL_TABLET | ORAL | Status: DC | PRN
  Administered 2015-09-04: 10 mg via ORAL
  Administered 2015-09-04: 5 mg via ORAL
  Administered 2015-09-04: 10 mg via ORAL
  Filled 2015-09-04 (×3): qty 2

## 2015-09-04 NOTE — Discharge Instructions (Signed)
Laparoscopic Appendectomy, Adult, Care After °Refer to this sheet in the next few weeks. These instructions provide you with information on caring for yourself after your procedure. Your caregiver may also give you more specific instructions. Your treatment has been planned according to current medical practices, but problems sometimes occur. Call your caregiver if you have any problems or questions after your procedure. °HOME CARE INSTRUCTIONS °· Do not drive while taking narcotic pain medicines. °· Use stool softener if you become constipated from your pain medicines. °· Change your bandages (dressings) as directed. °· Keep your wounds clean and dry. You may wash the wounds gently with soap and water. Gently pat the wounds dry with a clean towel. °· Do not take baths, swim, or use hot tubs for 10 days, or as instructed by your caregiver. °· Only take over-the-counter or prescription medicines for pain, discomfort, or fever as directed by your caregiver. °· You may continue your normal diet as directed. °· Do not lift more than 10 pounds (4.5 kg) or play contact sports for 3 weeks, or as directed. °· Slowly increase your activity after surgery. °· Take deep breaths to avoid getting a lung infection (pneumonia). °SEEK MEDICAL CARE IF: °· You have redness, swelling, or increasing pain in your wounds. °· You have pus coming from your wounds. °· You have drainage from a wound that lasts longer than 1 day. °· You notice a bad smell coming from the wounds or dressing. °· Your wound edges break open after stitches (sutures) have been removed. °· You notice increasing pain in the shoulders (shoulder strap areas) or near your shoulder blades. °· You develop dizzy episodes or fainting while standing. °· You develop shortness of breath. °· You develop persistent nausea or vomiting. °· You cannot control your bowel functions or lose your appetite. °· You develop diarrhea. °SEEK IMMEDIATE MEDICAL CARE IF:  °· You have a  fever. °· You develop a rash. °· You have difficulty breathing or sharp pains in your chest. °· You develop any reaction or side effects to medicines given. °MAKE SURE YOU: °· Understand these instructions. °· Will watch your condition. °· Will get help right away if you are not doing well or get worse. °  °This information is not intended to replace advice given to you by your health care provider. Make sure you discuss any questions you have with your health care provider. °  °Document Released: 05/07/2005 Document Revised: 09/21/2014 Document Reviewed: 10/25/2014 °Elsevier Interactive Patient Education ©2016 [employer] ° °

## 2015-09-04 NOTE — Progress Notes (Signed)
Discussed discharge summary with patient. Reviewed all medications with patient. Patient received Rx. Patient ready for discharge. 

## 2015-09-04 NOTE — Anesthesia Postprocedure Evaluation (Signed)
Anesthesia Post Note  Patient: Forestine NaCaleb F Scarpulla  Procedure(s) Performed: Procedure(s) (LRB): APPENDECTOMY LAPAROSCOPIC (N/A)  Patient location during evaluation: PACU Anesthesia Type: General Level of consciousness: awake and alert, oriented and patient cooperative Pain management: pain level controlled Vital Signs Assessment: post-procedure vital signs reviewed and stable Respiratory status: spontaneous breathing, nonlabored ventilation and respiratory function stable Cardiovascular status: blood pressure returned to baseline and stable Postop Assessment: no signs of nausea or vomiting Anesthetic complications: no    Last Vitals:  Filed Vitals:   09/04/15 0235 09/04/15 0627  BP: 125/70 112/71  Pulse: 81 77  Temp: 36.7 C 36.6 C  Resp: 18 18    Last Pain:  Filed Vitals:   09/04/15 0631  PainSc: Asleep                 Jeffrey Voth,E. Verner Kopischke

## 2015-09-04 NOTE — Discharge Summary (Signed)
Physician Discharge Summary  Patient ID: Jesse Swanson MRN: [id number] DOB/AGE: [birth year] 30 y.o.  Admit date: 09/03/2015 Discharge date: 09/04/2015  Admission Diagnoses:appendicitis  Discharge Diagnoses:  Active Problems:   Appendicitis s/p Lap appy  Discharged Condition: good  Hospital Course: PT admitted for acute appy. Pt underwent lap appy, see op note for full details.  He was sent to the floor post op.  He was transitioned to REG from CLD.  He tol Reg diet well.  He had good pain control.  HE was ambulating well on his own.  He was deemed stable for DC and Dc'd home.  Consults: None  Significant Diagnostic Studies: CT   Treatments: surgery: as above  Discharge Exam: Blood pressure 112/71, pulse 77, temperature 97.8 F (36.6 C), temperature source Oral, resp. rate 18, height 5\' 9"  (1.753 m), weight 114.896 kg (253 lb 4.8 oz), SpO2 95 %. General appearance: alert and cooperative GI: soft, non-tender; bowel sounds normal; no masses,  no organomegaly and incisions c/d/i  Disposition: 01-Home or Self Care  Discharge Instructions    Diet - low sodium heart healthy    Complete by:  As directed      Increase activity slowly    Complete by:  As directed             Medication List    STOP taking these medications        doxycycline 100 MG capsule  Commonly known as:  VIBRAMYCIN      TAKE these medications        fexofenadine 180 MG tablet  Commonly known as:  ALLEGRA  Take 180 mg by mouth daily.     ibuprofen 200 MG tablet  Commonly known as:  ADVIL,MOTRIN  Take 200 mg by mouth every 6 (six) hours as needed for mild pain.     oxyCODONE 5 MG immediate release tablet  Commonly known as:  Oxy IR/ROXICODONE  Take 1-2 tablets (5-10 mg total) by mouth every 4 (four) hours as needed for moderate pain.           Follow-up Information    [facility]   Specialty:  General Surgery   Why:  For wound re-check   Contact  information:   [address] [place] [id number] [phone]      Signed: Marigene EhlersRamirez Jr., Jed Limerickrmando 09/04/2015, 9:50 AM

## 2015-09-06 ENCOUNTER — Encounter (HOSPITAL_COMMUNITY): Payer: Self-pay | Admitting: General Surgery

## 2015-09-09 ENCOUNTER — Emergency Department (HOSPITAL_COMMUNITY): Payer: Commercial Managed Care - PPO

## 2015-09-09 ENCOUNTER — Encounter (HOSPITAL_COMMUNITY): Payer: Self-pay | Admitting: *Deleted

## 2015-09-09 ENCOUNTER — Inpatient Hospital Stay (HOSPITAL_COMMUNITY)
Admission: EM | Admit: 2015-09-09 | Discharge: 2015-09-13 | DRG: 373 | Disposition: A | Discharge status: Home / Self Care | Payer: Commercial Managed Care - PPO | Attending: General Surgery | Admitting: General Surgery

## 2015-09-09 DIAGNOSIS — A0472 Enterocolitis due to Clostridium difficile, not specified as recurrent: Secondary | ICD-10-CM | POA: Diagnosis present

## 2015-09-09 DIAGNOSIS — R51 Headache: Secondary | ICD-10-CM | POA: Diagnosis present

## 2015-09-09 DIAGNOSIS — Z79899 Other long term (current) drug therapy: Secondary | ICD-10-CM

## 2015-09-09 DIAGNOSIS — A047 Enterocolitis due to Clostridium difficile: Principal | ICD-10-CM | POA: Diagnosis present

## 2015-09-09 DIAGNOSIS — R509 Fever, unspecified: Secondary | ICD-10-CM | POA: Diagnosis not present

## 2015-09-09 DIAGNOSIS — F329 Major depressive disorder, single episode, unspecified: Secondary | ICD-10-CM | POA: Diagnosis present

## 2015-09-09 DIAGNOSIS — K529 Noninfective gastroenteritis and colitis, unspecified: Secondary | ICD-10-CM | POA: Diagnosis present

## 2015-09-09 DIAGNOSIS — G8918 Other acute postprocedural pain: Secondary | ICD-10-CM

## 2015-09-09 DIAGNOSIS — Z886 Allergy status to analgesic agent status: Secondary | ICD-10-CM

## 2015-09-09 DIAGNOSIS — D72829 Elevated white blood cell count, unspecified: Secondary | ICD-10-CM | POA: Diagnosis present

## 2015-09-09 DIAGNOSIS — Z9889 Other specified postprocedural states: Secondary | ICD-10-CM

## 2015-09-09 DIAGNOSIS — K37 Unspecified appendicitis: Secondary | ICD-10-CM | POA: Diagnosis present

## 2015-09-09 DIAGNOSIS — K219 Gastro-esophageal reflux disease without esophagitis: Secondary | ICD-10-CM | POA: Diagnosis present

## 2015-09-09 HISTORY — DX: Enterocolitis due to Clostridium difficile, not specified as recurrent: A04.72

## 2015-09-09 LAB — URINALYSIS, ROUTINE W REFLEX MICROSCOPIC
Bilirubin Urine: NEGATIVE
GLUCOSE, UA: NEGATIVE mg/dL
HGB URINE DIPSTICK: NEGATIVE
Ketones, ur: NEGATIVE mg/dL
Leukocytes, UA: NEGATIVE
Nitrite: NEGATIVE
Protein, ur: NEGATIVE mg/dL
SPECIFIC GRAVITY, URINE: 1.026 (ref 1.005–1.030)
pH: 7 (ref 5.0–8.0)

## 2015-09-09 LAB — CBC WITH DIFFERENTIAL/PLATELET
Basophils Absolute: 0 10*3/uL (ref 0.0–0.1)
Basophils Relative: 0 %
EOS PCT: 1 %
Eosinophils Absolute: 0.3 10*3/uL (ref 0.0–0.7)
HCT: 45 % (ref 39.0–52.0)
Hemoglobin: 15.3 g/dL (ref 13.0–17.0)
LYMPHS ABS: 2.8 10*3/uL (ref 0.7–4.0)
Lymphocytes Relative: 11 %
MCH: 29.9 pg (ref 26.0–34.0)
MCHC: 34 g/dL (ref 30.0–36.0)
MCV: 87.9 fL (ref 78.0–100.0)
MONOS PCT: 8 %
Monocytes Absolute: 2.1 10*3/uL — ABNORMAL HIGH (ref 0.1–1.0)
NEUTROS ABS: 20.6 10*3/uL — AB (ref 1.7–7.7)
Neutrophils Relative %: 80 %
PLATELETS: 371 10*3/uL (ref 150–400)
RBC: 5.12 MIL/uL (ref 4.22–5.81)
RDW: 11.4 % — AB (ref 11.5–15.5)
WBC: 25.8 10*3/uL — ABNORMAL HIGH (ref 4.0–10.5)

## 2015-09-09 LAB — COMPREHENSIVE METABOLIC PANEL
ALBUMIN: 3.8 g/dL (ref 3.5–5.0)
ALT: 62 U/L (ref 17–63)
AST: 29 U/L (ref 15–41)
Alkaline Phosphatase: 89 U/L (ref 38–126)
Anion gap: 13 (ref 5–15)
BILIRUBIN TOTAL: 1 mg/dL (ref 0.3–1.2)
BUN: 10 mg/dL (ref 6–20)
CHLORIDE: 101 mmol/L (ref 101–111)
CO2: 23 mmol/L (ref 22–32)
Calcium: 9.8 mg/dL (ref 8.9–10.3)
Creatinine, Ser: 0.98 mg/dL (ref 0.61–1.24)
GFR calc Af Amer: >60 mL/min (ref >60)
GFR calc non Af Amer: >60 mL/min (ref >60)
GLUCOSE: 110 mg/dL — AB (ref 65–99)
POTASSIUM: 4.5 mmol/L (ref 3.5–5.1)
Sodium: 137 mmol/L (ref 135–145)
Total Protein: 8.2 g/dL — ABNORMAL HIGH (ref 6.5–8.1)

## 2015-09-09 LAB — URINE MICROSCOPIC-ADD ON: Squamous Epithelial / LPF: NONE SEEN

## 2015-09-09 LAB — I-STAT CG4 LACTIC ACID, ED
LACTIC ACID, VENOUS: 1.61 mmol/L (ref 0.5–2.0)
LACTIC ACID, VENOUS: 2.01 mmol/L — AB (ref 0.5–2.0)

## 2015-09-09 MED ORDER — IOPAMIDOL (ISOVUE-300) INJECTION 61%: 15.0000 mL | INTRAVENOUS | Status: DC

## 2015-09-09 MED ORDER — DIATRIZOATE MEGLUMINE & SODIUM 66-10 % PO SOLN
ORAL | Status: AC
  Filled 2015-09-09: qty 30

## 2015-09-09 NOTE — ED Provider Notes (Signed)
CSN: [id number]     Arrival date & time 09/09/15  1728 History   First MD Initiated Contact with Patient 09/09/15 2119     Chief Complaint  Patient presents with  . Post-op Problem     (Consider location/radiation/quality/duration/timing/severity/associated sxs/prior Treatment) HPI Comments: Patient is a 30 year old Swanson with no significant past medical history. He presents for evaluation of fever and elevated WBC. He underwent an appendectomy approximately 6 days ago by Dr. Dwain SarnaWakefield. He seemed to be healing well until 2 days ago when he began spiking fevers to 102. [place] [facility] [place] [facility] [place] blood was drawn. They called him back later and informed him he had an elevated white count and should go to the ER to have an abscess ruled out. He does admit to ongoing lower abdominal discomfort. He also reports an episode of loose stool this evening. He denies any bloody stools. He denies any urinary complaints. He denies any cough or chest discomfort.  The history is provided by the patient.    History reviewed. No pertinent past medical history. Past Surgical History  Procedure Laterality Date  . Laparoscopic appendectomy N/A 09/03/2015    Procedure: APPENDECTOMY LAPAROSCOPIC;  Surgeon: Emelia LoronMatthew Wakefield, MD;  [facility];  Service: General;  Laterality: N/A;   No family history on file. Social History  Substance Use Topics  . Smoking status: Never Smoker   . Smokeless tobacco: None  . Alcohol Use: Yes     Comment: occ    Review of Systems  All other systems reviewed and are negative.     Allergies  Aspirin  Home Medications   Prior to Admission medications   Medication Sig Start Date End Date Taking? Authorizing Provider  fexofenadine (ALLEGRA) 180 MG tablet Take 180 mg by mouth daily.    Historical Provider, MD  ibuprofen (ADVIL,MOTRIN) 200 MG tablet Take 200 mg by mouth every 6 (six) hours as needed for mild pain.    Historical Provider, MD   oxyCODONE (OXY IR/ROXICODONE) 5 MG immediate release tablet Take 1-2 tablets (5-10 mg total) by mouth every 4 (four) hours as needed for moderate pain. 09/04/15   Axel FillerArmando Ramirez, MD   BP 143/97 mmHg  Pulse 130  Temp(Src) 99.9 F (37.7 C) (Oral)  Resp 16  Ht 5\' 9"  (1.753 m)  Wt 250 lb (113.399 kg)  BMI 36.90 kg/m2  SpO2 99% Physical Exam  Constitutional: He is oriented to person, place, and time. He appears well-developed and well-nourished. No distress.  HENT:  Head: Normocephalic and atraumatic.  Neck: Normal range of motion. Neck supple.  Cardiovascular: Normal rate, regular rhythm and normal heart sounds.   No murmur heard. Pulmonary/Chest: Effort normal and breath sounds normal. No respiratory distress. He has no wheezes. He has no rales.  Abdominal: Soft. He exhibits no distension and no mass. There is tenderness. There is no rebound and no guarding.  There is mild tenderness to palpation across the lower abdomen. The laparoscopy sites appear to be healing well. There is no significant redness, erythema, or purulent discharge.  Neurological: He is alert and oriented to person, place, and time.  Skin: Skin is warm and dry. He is not diaphoretic.  Nursing note and vitals reviewed.   ED Course  Procedures (including critical care time) Labs Review Labs Reviewed  COMPREHENSIVE METABOLIC PANEL - Abnormal; Notable for the following:    Glucose, Bld 110 (*)    Total Protein 8.2 (*)    All other  components within normal limits  URINALYSIS, ROUTINE W REFLEX MICROSCOPIC (NOT AT [facility]) - Abnormal; Notable for the following:    APPearance TURBID (*)    All other components within normal limits  CBC WITH DIFFERENTIAL/PLATELET - Abnormal; Notable for the following:    WBC 25.8 (*)    RDW 11.4 (*)    Neutro Abs 20.6 (*)    Monocytes Absolute 2.1 (*)    All other components within normal limits  URINE MICROSCOPIC-ADD ON - Abnormal; Notable for the following:    Bacteria, UA RARE (*)     Casts GRANULAR CAST (*)    All other components within normal limits  URINE CULTURE  I-STAT CG4 LACTIC ACID, ED  I-STAT CG4 LACTIC ACID, ED    Imaging Review Dg Chest 2 View  09/09/2015  CLINICAL DATA:  Appendectomy 7 days ago. Fever for 3 days. Cough for 2 weeks. Shortness of breath. EXAM: CHEST  2 VIEW COMPARISON:  06/08/2013; 09/03/2015 FINDINGS: The heart size and mediastinal contours are within normal limits. Both lungs are clear. The visualized skeletal structures are unremarkable. IMPRESSION: No active cardiopulmonary disease. Electronically Signed   By: Gaylyn Rong M.D.   On: 09/09/2015 19:28   I have personally reviewed and evaluated these images and lab results as part of my medical decision-making.   EKG Interpretation None      MDM   Final diagnoses:  None    Patient presents with generalized abdominal discomfort, elevated white cell count, and fever. He is 6 days status post laparoscopic appendectomy. He is currently awaiting a CT scan to determine whether he has developed an abscess at the surgical site. The patient is to be admitted to the surgical service under the care of Dr. Donell Beers. The results of the CT scan are pending.    Geoffery Lyons, MD 09/11/15 [phone]

## 2015-09-09 NOTE — ED Notes (Addendum)
PT had his appendix removed on Sat.  He's been monitoring his temp and today it was 102 today.  Incision sites clean and dry.  Pt denies any increased pain.  Has been taking ibuprofen for pain relief.  PT also c/o productive cough.

## 2015-09-10 ENCOUNTER — Emergency Department (HOSPITAL_COMMUNITY): Payer: Commercial Managed Care - PPO

## 2015-09-10 DIAGNOSIS — D72829 Elevated white blood cell count, unspecified: Secondary | ICD-10-CM | POA: Diagnosis present

## 2015-09-10 DIAGNOSIS — Z886 Allergy status to analgesic agent status: Secondary | ICD-10-CM | POA: Diagnosis not present

## 2015-09-10 DIAGNOSIS — K219 Gastro-esophageal reflux disease without esophagitis: Secondary | ICD-10-CM | POA: Diagnosis present

## 2015-09-10 DIAGNOSIS — R509 Fever, unspecified: Secondary | ICD-10-CM | POA: Diagnosis present

## 2015-09-10 DIAGNOSIS — K529 Noninfective gastroenteritis and colitis, unspecified: Secondary | ICD-10-CM | POA: Diagnosis present

## 2015-09-10 DIAGNOSIS — Z9889 Other specified postprocedural states: Secondary | ICD-10-CM | POA: Diagnosis not present

## 2015-09-10 DIAGNOSIS — Z79899 Other long term (current) drug therapy: Secondary | ICD-10-CM | POA: Diagnosis not present

## 2015-09-10 DIAGNOSIS — A047 Enterocolitis due to Clostridium difficile: Secondary | ICD-10-CM | POA: Diagnosis present

## 2015-09-10 DIAGNOSIS — F329 Major depressive disorder, single episode, unspecified: Secondary | ICD-10-CM | POA: Diagnosis present

## 2015-09-10 DIAGNOSIS — R51 Headache: Secondary | ICD-10-CM | POA: Diagnosis present

## 2015-09-10 LAB — COMPREHENSIVE METABOLIC PANEL
ALBUMIN: 3.1 g/dL — AB (ref 3.5–5.0)
ALK PHOS: 75 U/L (ref 38–126)
ALT: 48 U/L (ref 17–63)
AST: 22 U/L (ref 15–41)
Anion gap: 7 (ref 5–15)
BILIRUBIN TOTAL: 1 mg/dL (ref 0.3–1.2)
BUN: 7 mg/dL (ref 6–20)
CO2: 22 mmol/L (ref 22–32)
Calcium: 8.8 mg/dL — ABNORMAL LOW (ref 8.9–10.3)
Chloride: 104 mmol/L (ref 101–111)
Creatinine, Ser: 0.95 mg/dL (ref 0.61–1.24)
GFR calc Af Amer: >60 mL/min (ref >60)
GFR calc non Af Amer: >60 mL/min (ref >60)
GLUCOSE: 129 mg/dL — AB (ref 65–99)
Potassium: 3.8 mmol/L (ref 3.5–5.1)
Sodium: 133 mmol/L — ABNORMAL LOW (ref 135–145)
TOTAL PROTEIN: 6.8 g/dL (ref 6.5–8.1)

## 2015-09-10 LAB — CBC
HEMATOCRIT: 41.1 % (ref 39.0–52.0)
HEMOGLOBIN: 13.6 g/dL (ref 13.0–17.0)
MCH: 29.2 pg (ref 26.0–34.0)
MCHC: 33.1 g/dL (ref 30.0–36.0)
MCV: 88.2 fL (ref 78.0–100.0)
Platelets: 311 10*3/uL (ref 150–400)
RBC: 4.66 MIL/uL (ref 4.22–5.81)
RDW: 11.7 % (ref 11.5–15.5)
WBC: 17 10*3/uL — AB (ref 4.0–10.5)

## 2015-09-10 LAB — MAGNESIUM: Magnesium: 1.7 mg/dL (ref 1.7–2.4)

## 2015-09-10 LAB — PHOSPHORUS: Phosphorus: 3 mg/dL (ref 2.5–4.6)

## 2015-09-10 MED ORDER — OXYCODONE HCL 5 MG PO TABS
5.0000 mg | ORAL_TABLET | ORAL | Status: DC | PRN
  Administered 2015-09-10: 5 mg via ORAL
  Administered 2015-09-10 – 2015-09-11 (×8): 10 mg via ORAL
  Administered 2015-09-12: 5 mg via ORAL
  Filled 2015-09-10 (×2): qty 2
  Filled 2015-09-10: qty 1
  Filled 2015-09-10 (×6): qty 2
  Filled 2015-09-10: qty 1
  Filled 2015-09-10: qty 2

## 2015-09-10 MED ORDER — OXYCODONE HCL 5 MG PO TABS: 5.0000 mg | ORAL_TABLET | ORAL | Status: DC | PRN

## 2015-09-10 MED ORDER — METRONIDAZOLE IN NACL 5-0.79 MG/ML-% IV SOLN
500.0000 mg | Freq: Three times a day (TID) | INTRAVENOUS | Status: DC
  Administered 2015-09-10 – 2015-09-12 (×7): 500 mg via INTRAVENOUS
  Filled 2015-09-10 (×9): qty 100

## 2015-09-10 MED ORDER — ONDANSETRON HCL 4 MG/2ML IJ SOLN
4.0000 mg | Freq: Four times a day (QID) | INTRAMUSCULAR | Status: DC | PRN
  Administered 2015-09-11: 4 mg via INTRAVENOUS
  Filled 2015-09-10: qty 2

## 2015-09-10 MED ORDER — ACETAMINOPHEN 650 MG RE SUPP: 650.0000 mg | Freq: Four times a day (QID) | RECTAL | Status: DC | PRN

## 2015-09-10 MED ORDER — PIPERACILLIN-TAZOBACTAM 3.375 G IVPB
3.3750 g | Freq: Once | INTRAVENOUS | Status: DC
  Filled 2015-09-10: qty 50

## 2015-09-10 MED ORDER — PIPERACILLIN-TAZOBACTAM 3.375 G IVPB
3.3750 g | Freq: Once | INTRAVENOUS | Status: AC
  Administered 2015-09-10: 3.375 g via INTRAVENOUS

## 2015-09-10 MED ORDER — CIPROFLOXACIN IN D5W 400 MG/200ML IV SOLN
400.0000 mg | Freq: Two times a day (BID) | INTRAVENOUS | Status: DC
  Administered 2015-09-10: 400 mg via INTRAVENOUS
  Filled 2015-09-10 (×2): qty 200

## 2015-09-10 MED ORDER — PIPERACILLIN-TAZOBACTAM 3.375 G IVPB: 3.3750 g | Freq: Three times a day (TID) | INTRAVENOUS | Status: DC

## 2015-09-10 MED ORDER — SODIUM CHLORIDE 0.9 % IV BOLUS (SEPSIS)
1000.0000 mL | Freq: Once | INTRAVENOUS | Status: AC
  Administered 2015-09-10: 1000 mL via INTRAVENOUS

## 2015-09-10 MED ORDER — PIPERACILLIN-TAZOBACTAM 3.375 G IVPB
3.3750 g | Freq: Three times a day (TID) | INTRAVENOUS | Status: DC
  Administered 2015-09-10 – 2015-09-12 (×7): 3.375 g via INTRAVENOUS
  Filled 2015-09-10 (×9): qty 50

## 2015-09-10 MED ORDER — ONDANSETRON 4 MG PO TBDP: 4.0000 mg | ORAL_TABLET | Freq: Four times a day (QID) | ORAL | Status: DC | PRN

## 2015-09-10 MED ORDER — KCL IN DEXTROSE-NACL 10-5-0.45 MEQ/L-%-% IV SOLN
INTRAVENOUS | Status: DC
  Administered 2015-09-10 – 2015-09-13 (×4): via INTRAVENOUS
  Filled 2015-09-10 (×9): qty 1000

## 2015-09-10 MED ORDER — ENOXAPARIN SODIUM 40 MG/0.4ML ~~LOC~~ SOLN
40.0000 mg | SUBCUTANEOUS | Status: DC
  Administered 2015-09-10 – 2015-09-12 (×3): 40 mg via SUBCUTANEOUS
  Filled 2015-09-10 (×3): qty 0.4

## 2015-09-10 MED ORDER — MORPHINE SULFATE (PF) 2 MG/ML IV SOLN
1.0000 mg | INTRAVENOUS | Status: DC | PRN
  Administered 2015-09-10: 2 mg via INTRAVENOUS
  Filled 2015-09-10: qty 1

## 2015-09-10 MED ORDER — IOPAMIDOL (ISOVUE-300) INJECTION 61%
INTRAVENOUS | Status: AC
  Administered 2015-09-10: 100 mL
  Filled 2015-09-10: qty 100

## 2015-09-10 MED ORDER — METHOCARBAMOL 500 MG PO TABS
500.0000 mg | ORAL_TABLET | Freq: Four times a day (QID) | ORAL | Status: DC | PRN
  Administered 2015-09-11: 500 mg via ORAL
  Filled 2015-09-10 (×2): qty 1

## 2015-09-10 MED ORDER — ACETAMINOPHEN 325 MG PO TABS
650.0000 mg | ORAL_TABLET | Freq: Four times a day (QID) | ORAL | Status: DC | PRN
  Administered 2015-09-10: 650 mg via ORAL
  Filled 2015-09-10 (×3): qty 2

## 2015-09-10 MED ORDER — LORATADINE 10 MG PO TABS
10.0000 mg | ORAL_TABLET | Freq: Every day | ORAL | Status: DC
  Filled 2015-09-10: qty 1

## 2015-09-10 MED ORDER — PANTOPRAZOLE SODIUM 40 MG IV SOLR
40.0000 mg | Freq: Every day | INTRAVENOUS | Status: DC
  Administered 2015-09-10: 40 mg via INTRAVENOUS
  Filled 2015-09-10: qty 40

## 2015-09-10 NOTE — Progress Notes (Signed)
Pharmacy Antibiotic Note  Forestine NaCaleb F Morino is a 30 y.o. male admitted on 09/09/2015 with intra-abdominal infection s/p appendectomy ~1wk ago.  Pharmacy has been consulted for Zosyn dosing.  Plan: Zosyn 3.375g IV q8h (4 hour infusion).  Height: 5\' 9"  (175.3 cm) Weight: 250 lb (113.399 kg) IBW/kg (Calculated) : 70.7  Temp (24hrs), Avg:99.9 F (37.7 C), Min:99.9 F (37.7 C), Max:99.9 F (37.7 C)   Recent Labs Lab 09/03/15 1642 09/09/15 1827 09/09/15 1857 09/09/15 2122  WBC 17.5* 25.8*  --   --   CREATININE 0.89 0.98  --   --   LATICACIDVEN  --   --  1.61 2.01*    Estimated Creatinine Clearance: 139.4 mL/min (by C-G formula based on Cr of 0.98).    Allergies  Allergen Reactions  . Aspirin Swelling     Thank you for allowing pharmacy to be a part of this patient's care.  Vernard GamblesVeronda Ardice Boyan, PharmD, BCPS  09/10/2015 1:59 AM

## 2015-09-10 NOTE — Progress Notes (Signed)
Received patient from ED. Patient AOx4, ambulatory, VS stable, O2Sat at 99% on RA and pain 5/10.  CNA oriented patient to room, bed controls and call light. Patient went to bathroom to void.  Patient served with ice pop. Family members at bedside and updated on plan of care.

## 2015-09-10 NOTE — Progress Notes (Signed)
Pharmacy Antibiotic Note  Jesse Swanson How is a 30 y.o. male admForestine Naitted on 09/09/2015 with intra-abdominal infection s/p appendectomy ~1wk ago.  Pharmacy has been consulted for Zosyn dosing. WBC 17, afebrile.   Plan: Zosyn 3.375g IV q8h (4 hour infusion).  Pharmacy to sign off since renal fx is normal and no further dose adjustment are anticipated   Height: 5\' 9"  (175.3 cm) Weight: 248 lb 3.2 oz (112.583 kg) IBW/kg (Calculated) : 70.7  Temp (24hrs), Avg:98.9 Swanson (37.2 C), Min:97.9 Swanson (36.6 C), Max:99.9 Swanson (37.7 C)   Recent Labs Lab 09/03/15 1642 09/09/15 1827 09/09/15 1857 09/09/15 2122 09/10/15 0457  WBC 17.5* 25.8*  --   --  17.0*  CREATININE 0.89 0.98  --   --  0.95  LATICACIDVEN  --   --  1.61 2.01*  --     Estimated Creatinine Clearance: 143.3 mL/min (by C-G formula based on Cr of 0.95).    Allergies  Allergen Reactions  . Aspirin Swelling     Thank you for allowing pharmacy to be a part of this patient's care.  Vinnie LevelBenjamin Gradyn Shein, PharmD., BCPS Clinical Pharmacist Pager [phone]

## 2015-09-10 NOTE — ED Provider Notes (Signed)
Appendectomy on Saturday by Dr. Dwain SarnaWakefield. Fever x 2 days - in office visit today. Found to have leukocytosis, sent by office to r/o abscess post-op.   Pending CT to evaluate. Consult Surgery with results.   2:00 - patient seen and evaluated by surgery prior to CT results obtained and admission orders written. No contact or discussion with surgeon Donell Beers([place]).   Elpidio AnisShari Jared Cahn, PA-C 09/10/15 0200  Geoffery Lyonsouglas Delo, MD 09/11/15 [phone]

## 2015-09-10 NOTE — H&P (Signed)
Jesse Swanson is an 30 y.o. male.   Chief Complaint: fever and abdominal pain, s/p appendectomy  HPI:  Patient is a 30 year old male who is extremely status post laparoscopic appendectomy. He did not have any perforation and had laparoscopic cholecystectomy by Dr. Donne Hazel on 09/03/2015.  The patient was doing much better until Monday 2 days ago. He started to fill some increased abdominal pain. He also started having fevers on Thursday. Friday morning his fever had gone up as high doing 102.1. Because of the increased abdominal discomfort he was having and his fever, any color office. CBC was ordered. This was elevated to 20. He was sent to the emergency department to rule out an abscess with a CT scan. He remains feeling very bad. He has some nausea. He also feels very weak and is quite sore on the right side. He also reports some soreness on the left lateral abdomen.  History reviewed. No pertinent past medical history.  Past Surgical History  Procedure Laterality Date  . Laparoscopic appendectomy N/A 09/03/2015    Procedure: APPENDECTOMY LAPAROSCOPIC;  Surgeon: Rolm Bookbinder, MD;  Location: [facility];  Service: General;  Laterality: N/A;    No family history on file. Social History:  reports that he has never smoked. He does not have any smokeless tobacco history on file. He reports that he drinks alcohol. He reports that he does not use illicit drugs.  Allergies:  Allergies  Allergen Reactions  . Aspirin Swelling    meds  Allegra advil oxycodone  Results for orders placed or performed during the hospital encounter of 09/09/15 (from the past 48 hour(s))  Comprehensive metabolic panel     Status: Abnormal   Collection Time: 09/09/15  6:27 PM  Result Value Ref Range   Sodium 137 135 - 145 mmol/L   Potassium 4.5 3.5 - 5.1 mmol/L   Chloride 101 101 - 111 mmol/L   CO2 23 22 - 32 mmol/L   Glucose, Bld 110 (H) 65 - 99 mg/dL   BUN 10 6 - 20 mg/dL   Creatinine, Ser 0.98 0.61 - 1.24  mg/dL   Calcium 9.8 8.9 - 10.3 mg/dL   Total Protein 8.2 (H) 6.5 - 8.1 g/dL   Albumin 3.8 3.5 - 5.0 g/dL   AST 29 15 - 41 U/L   ALT 62 17 - 63 U/L   Alkaline Phosphatase 89 38 - 126 U/L   Total Bilirubin 1.0 0.3 - 1.2 mg/dL   GFR calc non Af Amer >60 >60 mL/min   GFR calc Af Amer >60 >60 mL/min    Comment: (NOTE) The eGFR has been calculated using the CKD EPI equation. This calculation has not been validated in all clinical situations. eGFR's persistently <60 mL/min signify possible Chronic Kidney Disease.    Anion gap 13 5 - 15  CBC with Differential     Status: Abnormal   Collection Time: 09/09/15  6:27 PM  Result Value Ref Range   WBC 25.8 (H) 4.0 - 10.5 K/uL   RBC 5.12 4.22 - 5.81 MIL/uL   Hemoglobin 15.3 13.0 - 17.0 g/dL   HCT 45.0 39.0 - 52.0 %   MCV 87.9 78.0 - 100.0 fL   MCH 29.9 26.0 - 34.0 pg   MCHC 34.0 30.0 - 36.0 g/dL   RDW 11.4 (L) 11.5 - 15.5 %   Platelets 371 150 - 400 K/uL   Neutrophils Relative % 80 %   Lymphocytes Relative 11 %   Monocytes Relative 8 %  Eosinophils Relative 1 %   Basophils Relative 0 %   Neutro Abs 20.6 (H) 1.7 - 7.7 K/uL   Lymphs Abs 2.8 0.7 - 4.0 K/uL   Monocytes Absolute 2.1 (H) 0.1 - 1.0 K/uL   Eosinophils Absolute 0.3 0.0 - 0.7 K/uL   Basophils Absolute 0.0 0.0 - 0.1 K/uL  Urinalysis, Routine w reflex microscopic (not at [facility])     Status: Abnormal   Collection Time: 09/09/15  6:44 PM  Result Value Ref Range   Color, Urine YELLOW YELLOW   APPearance TURBID (A) CLEAR   Specific Gravity, Urine 1.026 1.005 - 1.030   pH 7.0 5.0 - 8.0   Glucose, UA NEGATIVE NEGATIVE mg/dL   Hgb urine dipstick NEGATIVE NEGATIVE   Bilirubin Urine NEGATIVE NEGATIVE   Ketones, ur NEGATIVE NEGATIVE mg/dL   Protein, ur NEGATIVE NEGATIVE mg/dL   Nitrite NEGATIVE NEGATIVE   Leukocytes, UA NEGATIVE NEGATIVE  Urine microscopic-add on     Status: Abnormal   Collection Time: 09/09/15  6:44 PM  Result Value Ref Range   Squamous Epithelial / LPF NONE SEEN  NONE SEEN   WBC, UA 0-5 0 - 5 WBC/hpf   RBC / HPF 0-5 0 - 5 RBC/hpf   Bacteria, UA RARE (A) NONE SEEN   Casts GRANULAR CAST (A) NEGATIVE   Urine-Other AMORPHOUS URATES/PHOSPHATES   I-Stat CG4 Lactic Acid, ED  (not at [facility])     Status: None   Collection Time: 09/09/15  6:57 PM  Result Value Ref Range   Lactic Acid, Venous 1.61 0.5 - 2.0 mmol/L  I-Stat CG4 Lactic Acid, ED  (not at [facility])     Status: Abnormal   Collection Time: 09/09/15  9:22 PM  Result Value Ref Range   Lactic Acid, Venous 2.01 (HH) 0.5 - 2.0 mmol/L   Comment NOTIFIED PHYSICIAN    Dg Chest 2 View  09/09/2015  CLINICAL DATA:  Appendectomy 7 days ago. Fever for 3 days. Cough for 2 weeks. Shortness of breath. EXAM: CHEST  2 VIEW COMPARISON:  06/08/2013; 09/03/2015 FINDINGS: The heart size and mediastinal contours are within normal limits. Both lungs are clear. The visualized skeletal structures are unremarkable. IMPRESSION: No active cardiopulmonary disease. Electronically Signed   By: Van Clines M.D.   On: 09/09/2015 19:28    Review of Systems  Constitutional: Positive for fever.  Eyes: Negative.   Respiratory: Negative.   Cardiovascular: Negative.   Gastrointestinal: Positive for nausea and abdominal pain.  Genitourinary: Negative.   Musculoskeletal: Negative.   Skin: Negative.   Neurological: Positive for headaches.  Endo/Heme/Allergies: Negative.   Psychiatric/Behavioral: Negative.   All other systems reviewed and are negative.   Blood pressure 123/80, pulse 99, temperature 99.9 F (37.7 C), temperature source Oral, resp. rate 16, height '5\' 9"'$  (1.753 m), weight 113.399 kg (250 lb), SpO2 99 %. Physical Exam  Constitutional: He is oriented to person, place, and time. He appears well-developed and well-nourished. He appears distressed (looks uncomfortable).  HENT:  Head: Normocephalic and atraumatic.  Right Ear: External ear normal.  Left Ear: External ear normal.  Eyes: Conjunctivae are normal. Pupils  are equal, round, and reactive to light. No scleral icterus.  Neck: Neck supple. No tracheal deviation present.  Cardiovascular: Regular rhythm and intact distal pulses.   Mild tachycardia.    Respiratory: Effort normal. No respiratory distress.  GI: Soft. He exhibits no distension. There is tenderness (RLQ and left mid abdomen). There is no rebound and no guarding.  Musculoskeletal: Normal  range of motion. He exhibits no edema or tenderness.  Neurological: He is alert and oriented to person, place, and time. Coordination normal.  Skin: Skin is warm and dry. No rash noted. He is not diaphoretic. No erythema. No pallor.  Psychiatric: He has a normal mood and affect. His behavior is normal. Judgment and thought content normal.     Assessment/Plan Colitis.  No evidence of abscess. Will admit due the amount of fever, leukocytosis, and malaise.   Will place on IV antibiotics. Clears are OK.    Stark Klein, MD 09/10/2015, 1:53 AM

## 2015-09-11 LAB — URINE CULTURE

## 2015-09-11 LAB — BASIC METABOLIC PANEL
Anion gap: 9 (ref 5–15)
CHLORIDE: 104 mmol/L (ref 101–111)
CO2: 23 mmol/L (ref 22–32)
CREATININE: 0.98 mg/dL (ref 0.61–1.24)
Calcium: 8.9 mg/dL (ref 8.9–10.3)
GFR calc Af Amer: >60 mL/min (ref >60)
GFR calc non Af Amer: >60 mL/min (ref >60)
GLUCOSE: 99 mg/dL (ref 65–99)
POTASSIUM: 3.9 mmol/L (ref 3.5–5.1)
SODIUM: 136 mmol/L (ref 135–145)

## 2015-09-11 LAB — CBC
HCT: 42 % (ref 39.0–52.0)
Hemoglobin: 13.8 g/dL (ref 13.0–17.0)
MCH: 28.9 pg (ref 26.0–34.0)
MCHC: 32.9 g/dL (ref 30.0–36.0)
MCV: 88.1 fL (ref 78.0–100.0)
PLATELETS: 272 10*3/uL (ref 150–400)
RBC: 4.77 MIL/uL (ref 4.22–5.81)
RDW: 11.5 % (ref 11.5–15.5)
WBC: 9.7 10*3/uL (ref 4.0–10.5)

## 2015-09-11 LAB — C DIFFICILE QUICK SCREEN W PCR REFLEX
C Diff antigen: POSITIVE — AB
C Diff toxin: NEGATIVE

## 2015-09-11 MED ORDER — PANTOPRAZOLE SODIUM 40 MG PO TBEC
40.0000 mg | DELAYED_RELEASE_TABLET | Freq: Every day | ORAL | Status: DC
  Administered 2015-09-11 – 2015-09-12 (×2): 40 mg via ORAL
  Filled 2015-09-11 (×2): qty 1

## 2015-09-11 NOTE — Progress Notes (Signed)
[facility] Progress Note     Subjective: Pt feels much better today.  Had a bout of nausea when he was having a BM, but none since.  Ambulating well.  Still feels weak, mildly bloated.  Having diarrhea and lots of flatus.    Objective: Vital signs in last 24 hours: Temp:  [97.7 F (36.5 C)-98.2 F (36.8 C)] 97.7 F (36.5 C) (04/23 0426) Pulse Rate:  [71-85] 78 (04/23 0426) Resp:  [17] 17 (04/23 0426) BP: (122-133)/(77-80) 122/77 mmHg (04/23 0426) SpO2:  [98 %-100 %] 100 % (04/23 0426) Last BM Date: 09/10/15  Intake/Output from previous day: 04/22 0701 - 04/23 0700 In: 3656.7 [P.O.:1160; I.V.:2346.7; IV Piggyback:150] Out: -  Intake/Output this shift:    PE: Gen:  Alert, NAD, pleasant Abd: Soft, mild distension, minimally tender, +BS, no HSM, incisions C/D/I   Lab Results:   Recent Labs  09/09/15 1827 09/10/15 0457  WBC 25.8* 17.0*  HGB 15.3 13.6  HCT 45.0 41.1  PLT 371 311   BMET  Recent Labs  09/09/15 1827 09/10/15 0457  NA 137 133*  K 4.5 3.8  CL 101 104  CO2 23 22  GLUCOSE 110* 129*  BUN 10 7  CREATININE 0.98 0.95  CALCIUM 9.8 8.8*   PT/INR No results for input(s): LABPROT, INR in the last 72 hours. CMP     Component Value Date/Time   NA 133* 09/10/2015 0457   K 3.8 09/10/2015 0457   CL 104 09/10/2015 0457   CO2 22 09/10/2015 0457   GLUCOSE 129* 09/10/2015 0457   BUN 7 09/10/2015 0457   CREATININE 0.95 09/10/2015 0457   CALCIUM 8.8* 09/10/2015 0457   PROT 6.8 09/10/2015 0457   ALBUMIN 3.1* 09/10/2015 0457   AST 22 09/10/2015 0457   ALT 48 09/10/2015 0457   ALKPHOS 75 09/10/2015 0457   BILITOT 1.0 09/10/2015 0457   GFRNONAA >60 09/10/2015 0457   GFRAA >60 09/10/2015 0457   Lipase  No results found for: LIPASE     Studies/Results: Dg Chest 2 View  09/09/2015  CLINICAL DATA:  Appendectomy 7 days ago. Fever for 3 days. Cough for 2 weeks. Shortness of breath. EXAM: CHEST  2 VIEW COMPARISON:  06/08/2013; 09/03/2015  FINDINGS: The heart size and mediastinal contours are within normal limits. Both lungs are clear. The visualized skeletal structures are unremarkable. IMPRESSION: No active cardiopulmonary disease. Electronically Signed   By: Gaylyn Rong M.D.   On: 09/09/2015 19:28   Ct Abdomen Pelvis W Contrast  09/10/2015  CLINICAL DATA:  Appendectomy last week, abdominal pain, fever, and diarrhea. Elevated white blood cell count. EXAM: CT ABDOMEN AND PELVIS WITH CONTRAST TECHNIQUE: Multidetector CT imaging of the abdomen and pelvis was performed using the standard protocol following bolus administration of intravenous contrast. CONTRAST:  100 cc Isovue-300 IV COMPARISON:  Preoperative CT 09/03/2015 FINDINGS: Lower chest: The included lung bases are clear. No pleural effusion. Liver: No focal lesion. Hepatobiliary: Gallbladder physiologically distended, no calcified stone. No biliary dilatation. Pancreas: No ductal dilatation or inflammation. Spleen: Normal. Adrenal glands: No nodule. Kidneys: Symmetric renal enhancement and excretion. No hydronephrosis. 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 Stomach/Bowel: Stomach physiologically distended. There are no dilated or thickened small bowel loops. Colonic wall thickening about the cecum and ascending colon. No additional colonic wall thickening. Post appendectomy with clips at the base of the cecum. No bowel obstruction, oral contrast throughout the large and small bowel. Vascular/Lymphatic: No retroperitoneal adenopathy. Prominent right lower quadrant mesenteric lymph nodes.  Abdominal aorta is normal in caliber. Reproductive: Normal prostate gland. Bladder: Minimally distended, no wall thickening. Other: Tiny locular of air anterior to the liver, likely postsurgical. Minimal fluid in the right pericolic gutter with soft tissue stranding. No loculated fluid collection or abscess. Fat within both inguinal canals. Musculoskeletal: There are no acute or suspicious osseous  abnormalities. IMPRESSION: 1. No evidence of abscess post appendectomy. 2. Mild colonic wall thickening involving the ascending colon with adjacent soft tissue stranding and trace pericolic fluid. Findings suggest colitis, may be infectious or inflammatory. There are multiple prominent lymph nodes in the ileocolic chain, likely reactive. Electronically Signed   By: Rubye OaksMelanie  Ehinger M.D.   On: 09/10/2015 02:06    Anti-infectives: Anti-infectives    Start     Dose/Rate Route Frequency Ordered Stop   09/10/15 0800  piperacillin-tazobactam (ZOSYN) IVPB 3.375 g     3.375 g 12.5 mL/hr over 240 Minutes Intravenous Every 8 hours 09/10/15 0159     09/10/15 0600  piperacillin-tazobactam (ZOSYN) IVPB 3.375 g  Status:  Discontinued     3.375 g 12.5 mL/hr over 240 Minutes Intravenous Every 8 hours 09/10/15 0157 09/10/15 0157   09/10/15 0200  ciprofloxacin (CIPRO) IVPB 400 mg  Status:  Discontinued     400 mg 200 mL/hr over 60 Minutes Intravenous Every 12 hours 09/10/15 0158 09/10/15 0846   09/10/15 0200  metroNIDAZOLE (FLAGYL) IVPB 500 mg     500 mg 100 mL/hr over 60 Minutes Intravenous Every 8 hours 09/10/15 0158     09/10/15 0200  piperacillin-tazobactam (ZOSYN) IVPB 3.375 g     3.375 g 12.5 mL/hr over 240 Minutes Intravenous  Once 09/10/15 0159 09/10/15 0602   09/10/15 0045  piperacillin-tazobactam (ZOSYN) IVPB 3.375 g  Status:  Discontinued     3.375 g 12.5 mL/hr over 240 Minutes Intravenous  Once 09/10/15 0043 09/10/15 0157       Assessment/Plan S/p lap appy for acute appendicitis with serositis - Dr. Dwain SarnaWakefield Re-hospitalized with Colitis -No evidence of crohns on pathology report -No evidence of abscess on CT scan -IV antibiotics - Zosyn & Flagyl Day #2 -Tolerated clears, advance to fulls and soft at lunch if tolerating -Maybe d/c home today or Tomorrow? -Recheck labs pending   LOS: 1 day    Nonie HoyerMegan N Kit Mollett 09/11/2015, 8:14 AM Pager: [phone]  (7am - 4:30pm M-F; 7am - 11:30am  Sa/Su)

## 2015-09-12 LAB — CBC
HEMATOCRIT: 42.1 % (ref 39.0–52.0)
Hemoglobin: 14.1 g/dL (ref 13.0–17.0)
MCH: 29.4 pg (ref 26.0–34.0)
MCHC: 33.5 g/dL (ref 30.0–36.0)
MCV: 87.7 fL (ref 78.0–100.0)
Platelets: 340 10*3/uL (ref 150–400)
RBC: 4.8 MIL/uL (ref 4.22–5.81)
RDW: 11.4 % — ABNORMAL LOW (ref 11.5–15.5)
WBC: 8.8 10*3/uL (ref 4.0–10.5)

## 2015-09-12 LAB — BASIC METABOLIC PANEL
ANION GAP: 11 (ref 5–15)
BUN: 6 mg/dL (ref 6–20)
CHLORIDE: 102 mmol/L (ref 101–111)
CO2: 25 mmol/L (ref 22–32)
Calcium: 9.3 mg/dL (ref 8.9–10.3)
Creatinine, Ser: 1.09 mg/dL (ref 0.61–1.24)
GFR calc Af Amer: >60 mL/min (ref >60)
GFR calc non Af Amer: >60 mL/min (ref >60)
GLUCOSE: 92 mg/dL (ref 65–99)
POTASSIUM: 3.7 mmol/L (ref 3.5–5.1)
Sodium: 138 mmol/L (ref 135–145)

## 2015-09-12 LAB — CLOSTRIDIUM DIFFICILE BY PCR: CDIFFPCR: POSITIVE — AB

## 2015-09-12 MED ORDER — VANCOMYCIN 50 MG/ML ORAL SOLUTION
125.0000 mg | Freq: Four times a day (QID) | ORAL | Status: DC
  Administered 2015-09-12 – 2015-09-13 (×4): 125 mg via ORAL
  Filled 2015-09-12 (×5): qty 2.5

## 2015-09-12 NOTE — Progress Notes (Signed)
  Subjective: He is feeling better, tolerating soft diet.  BM x 2 only.    Objective: Vital signs in last 24 hours: Temp:  [97.7 F (36.5 C)-98.2 F (36.8 C)] 97.8 F (36.6 C) (04/24 0512) Pulse Rate:  [66-78] 66 (04/24 0512) Resp:  [17-18] 17 (04/24 0512) BP: (109-123)/(64-74) 119/64 mmHg (04/24 0512) SpO2:  [98 %-100 %] 98 % (04/24 0512) Last BM Date: 09/11/15 1310 PO 968 IV BM x 2 Voided x 9  Afebrile, VSS Labs OK Intake/Output from previous day: 04/23 0701 - 04/24 0700 In: 2328.3 [P.O.:1310; I.V.:968.3; IV Piggyback:50] Out: -  Intake/Output this shift:    General appearance: alert, cooperative and no distress GI: soft, sites all look good.    Lab Results:   Recent Labs  09/11/15 0824 09/12/15 0449  WBC 9.7 8.8  HGB 13.8 14.1  HCT 42.0 42.1  PLT 272 340    BMET  Recent Labs  09/11/15 0824 09/12/15 0449  NA 136 138  K 3.9 3.7  CL 104 102  CO2 23 25  GLUCOSE 99 92  BUN <5* 6  CREATININE 0.98 1.09  CALCIUM 8.9 9.3   PT/INR No results for input(s): LABPROT, INR in the last 72 hours.   Recent Labs Lab 09/09/15 1827 09/10/15 0457  AST 29 22  ALT 62 48  ALKPHOS 89 75  BILITOT 1.0 1.0  PROT 8.2* 6.8  ALBUMIN 3.8 3.1*     Lipase  No results found for: LIPASE   Studies/Results: No results found.  Medications: . enoxaparin (LOVENOX) injection  40 mg Subcutaneous Q24H  . metronidazole  500 mg Intravenous Q8H  . pantoprazole  40 mg Oral QHS  . piperacillin-tazobactam (ZOSYN)  IV  3.375 g Intravenous Q8H   . dextrose 5 % and 0.45 % NaCl with KCl 10 mEq/L 50 mL/hr (09/11/15 0851)    Assessment/Plan S/p lap appy for acute appendicitis with serositis 09/03/15 - Dr. Dwain SarnaWakefield Re-hospitalized with Colitis 09/10/15 C diff antigen positive/toxin negative 09/11/15 FEN: Soft diet/IV fluids above ID: Zosyn/Metronidazole day 4  (discussed with pharmacy will start PO vancomycin 125 mg qid) VTE:  Lovenox/SCD    Plan:  Stop the IV antibiotics.   Start oral vancomycin, and see how he does.  We could add oral Flagyl, but with normal WBC and less symptomatic I will hold off until I talk with Dr. Luisa Hartornett.       LOS: 2 days    Meta Kroenke 09/12/2015 [phone]

## 2015-09-12 NOTE — Care Management Note (Signed)
Case Management Note  Patient Details  Name: Jesse Swanson MRN: [id number] Date of Birth: [birth year]  Subjective/Objective:                    Action/Plan:   Expected Discharge Date:                  Expected Discharge Plan:  Home/Self Care  In-House Referral:     Discharge planning Services     Post Acute Care Choice:    Choice offered to:     DME Arranged:    DME Agency:     HH Arranged:    HH Agency:     Status of Service:  In process, will continue to follow  Medicare Important Message Given:    Date Medicare IM Given:    Medicare IM give by:    Date Additional Medicare IM Given:    Additional Medicare Important Message give by:     If discussed at Long Length of Stay Meetings, dates discussed:    Additional Comments:  Kingsley PlanWile, Magie Ciampa Marie, RN 09/12/2015, 11:00 AM

## 2015-09-12 NOTE — Progress Notes (Signed)
CRITICAL VALUE ALERT  Critical value received: PCR from stool sample positive Date of notification: 09/12/15 Time of notification: 1645 Critical value read back:yes Nurse who received alert: Caswell CorwinJ. Deryn Massengale, RN MD notified (1st page): Will Marlyne BeardsJennings Time of first page: 1650 MD notified (2nd page):N/A  Time of second page:N/A  Responding MD:  Zola ButtonWill Jennings Time MD responded:  [phone]

## 2015-09-13 ENCOUNTER — Encounter (HOSPITAL_COMMUNITY): Payer: Self-pay | Admitting: General Surgery

## 2015-09-13 DIAGNOSIS — A0472 Enterocolitis due to Clostridium difficile, not specified as recurrent: Secondary | ICD-10-CM

## 2015-09-13 HISTORY — DX: Enterocolitis due to Clostridium difficile, not specified as recurrent: A04.72

## 2015-09-13 LAB — CBC
HEMATOCRIT: 42.3 % (ref 39.0–52.0)
Hemoglobin: 14.5 g/dL (ref 13.0–17.0)
MCH: 29.3 pg (ref 26.0–34.0)
MCHC: 34.3 g/dL (ref 30.0–36.0)
MCV: 85.5 fL (ref 78.0–100.0)
Platelets: 360 10*3/uL (ref 150–400)
RBC: 4.95 MIL/uL (ref 4.22–5.81)
RDW: 11.3 % — AB (ref 11.5–15.5)
WBC: 9.1 10*3/uL (ref 4.0–10.5)

## 2015-09-13 MED ORDER — SACCHAROMYCES BOULARDII 250 MG PO CAPS: ORAL_CAPSULE | ORAL | Status: AC

## 2015-09-13 MED ORDER — VANCOMYCIN 50 MG/ML ORAL SOLUTION: 125.0000 mg | Freq: Four times a day (QID) | ORAL | Status: AC

## 2015-09-13 NOTE — Discharge Instructions (Signed)
Clostridium Difficile Infection Clostridium difficile (C. difficile or C. diff) is a bacterium normally found in the intestinal tract or colon. C. difficile infection causes diarrhea and sometimes a severe disease called pseudomembranous colitis (C. difficile colitis). C. difficile colitis can damage the lining of the colon or cause the colon to become very large (toxic megacolon). Older adults and people with certain medical conditions have a greater risk of getting C. difficile infections. CAUSES The balance of bacteria in your colon can change when you are sick, especially when taking antibiotic medicine. Taking antibiotics may allow the C. difficile to grow, multiply, and make a toxin that causes C. difficile infection.  SYMPTOMS  Diarrhea.  Fever.  Fatigue.  Loss of appetite.  Nausea.  Abdominal swelling, pain, or tenderness.  Dehydration. DIAGNOSIS Your health care provider may suspect C. difficile infection based on your symptoms and if you have taken antibiotics recently. Your health care provider may also order:  A lab test that can detect the toxin in your stool.  A sigmoidoscopy or colonoscopy to look at the appearance of your colon. These procedures involve passing an instrument through your rectum to look at the inside of your colon. Your health care provider will help determine if these tests are necessary. TREATMENT Treatment may include:  Taking antibiotics that keep C. difficile from growing.  Stopping the antibiotics you were on before the C. difficile infection began. Only do this if instructed to do so by your health care provider.  IV fluids and correction of electrolyte imbalance.  Surgery to remove the infected part of the intestines. This is rare. HOME CARE INSTRUCTIONS  Drink enough fluids to keep your urine clear or pale yellow. Avoid milk, caffeine, and alcohol.  Ask your health care provider for specific rehydration instructions.  Eat small,  frequent meals rather than large meals.  Take your antibiotics as directed. Finish them even if you start to feel better.  Do not use medicines to slow diarrhea. This could delay healing or cause problems.  Wash your hands thoroughly after using the bathroom and before preparing food. Make sure people who live with you wash their hands often, too.  Clean all surfaces with a product that contains chlorine bleach. SEEK MEDICAL CARE IF:  Your diarrhea lasts longer than expected or comes back after you finish your antibiotic medicine for the C. difficile infection.  You have trouble staying hydrated.  You have a fever. SEEK IMMEDIATE MEDICAL CARE IF:  You have increasing abdominal pain or tenderness.  You have blood in your stools, or your stools look dark black and tarry.  You cannot eat or drink without vomiting.   This information is not intended to replace advice given to you by your health care provider. Make sure you discuss any questions you have with your health care provider.   Document Released: 02/14/2005 Document Revised: 05/28/2014 Document Reviewed: 11/08/2014 Elsevier Interactive Patient Education 2016 [employer] CCS ______[facility] LAPAROSCOPIC SURGERY: POST OP INSTRUCTIONS Always review your discharge instruction sheet given to you by the facility where your surgery was performed. IF YOU HAVE DISABILITY OR FAMILY LEAVE FORMS, YOU MUST BRING THEM TO THE OFFICE FOR PROCESSING.   DO NOT GIVE THEM TO YOUR DOCTOR.  1. A prescription for pain medication may be given to you upon discharge.  Take your pain medication as prescribed, if needed.  If narcotic pain medicine is not needed, then you may take acetaminophen (Tylenol) or ibuprofen (Advil) as needed. 2. Take  your usually prescribed medications unless otherwise directed. 3. If you need a refill on your pain medication, please contact your pharmacy.  They will contact our office to request  authorization. Prescriptions will not be filled after 5pm or on week-ends. 4. You should follow a light diet the first few days after arrival home, such as soup and crackers, etc.  Be sure to include lots of fluids daily. 5. Most patients will experience some swelling and bruising in the area of the incisions.  Ice packs will help.  Swelling and bruising can take several days to resolve.  6. It is common to experience some constipation if taking pain medication after surgery.  Increasing fluid intake and taking a stool softener (such as Colace) will usually help or prevent this problem from occurring.  A mild laxative (Milk of Magnesia or Miralax) should be taken according to package instructions if there are no bowel movements after 48 hours. 7. Unless discharge instructions indicate otherwise, you may remove your bandages 24-48 hours after surgery, and you may shower at that time.  You may have steri-strips (small skin tapes) in place directly over the incision.  These strips should be left on the skin for 7-10 days.  If your surgeon used skin glue on the incision, you may shower in 24 hours.  The glue will flake off over the next 2-3 weeks.  Any sutures or staples will be removed at the office during your follow-up visit. 8. ACTIVITIES:  You may resume regular (light) daily activities beginning the next day--such as daily self-care, walking, climbing stairs--gradually increasing activities as tolerated.  You may have sexual intercourse when it is comfortable.  Refrain from any heavy lifting or straining until approved by your doctor. a. You may drive when you are no longer taking prescription pain medication, you can comfortably wear a seatbelt, and you can safely maneuver your car and apply brakes. b. RETURN TO WORK:  __________________________________________________________ 9. You should see your doctor in the office for a follow-up appointment approximately 2-3 weeks after your surgery.  Make sure  that you call for this appointment within a day or two after you arrive home to insure a convenient appointment time. 10. OTHER INSTRUCTIONS: __________________________________________________________________________________________________________________________ __________________________________________________________________________________________________________________________ WHEN TO CALL YOUR DOCTOR: 1. Fever over 101.0 2. Inability to urinate 3. Continued bleeding from incision. 4. Increased pain, redness, or drainage from the incision. 5. Increasing abdominal pain  The clinic staff is available to answer your questions during regular business hours.  Please dont hesitate to call and ask to speak to one of the nurses for clinical concerns.  If you have a medical emergency, go to the nearest emergency room or call 911.  A surgeon from [facility] is always on call at the hospital. [address], [place], [place]  [address] ? [address] [address], [place], [place]   [phone] ? [phone] ? FAX [phone] Web site: [email or web address]

## 2015-09-13 NOTE — Progress Notes (Signed)
  Subjective: Stools becoming more solid, and he feels much better, down to 2 yesterday and one this AM almost normal.  Objective: Vital signs in last 24 hours: Temp:  [98.1 F (36.7 C)-98.5 F (36.9 C)] 98.1 F (36.7 C) (04/25 0445) Pulse Rate:  [73-85] 73 (04/25 0445) Resp:  [16-17] 17 (04/25 0445) BP: (113-132)/(68-83) 113/68 mmHg (04/25 0445) SpO2:  [100 %] 100 % (04/25 0445) Last BM Date: 09/12/15 1080 po bm X 2 Afebrile, VSS CBC is normal Intake/Output from previous day: 04/24 0701 - 04/25 0700 In: 2433.3 [P.O.:1080; I.V.:1303.3; IV Piggyback:50] Out: -  Intake/Output this shift:    General appearance: alert, cooperative and no distress GI: soft, non-tender; bowel sounds normal; no masses,  no organomegaly  Lab Results:   Recent Labs  09/12/15 0449 09/13/15 0637  WBC 8.8 9.1  HGB 14.1 14.5  HCT 42.1 42.3  PLT 340 360    BMET  Recent Labs  09/11/15 0824 09/12/15 0449  NA 136 138  K 3.9 3.7  CL 104 102  CO2 23 25  GLUCOSE 99 92  BUN <5* 6  CREATININE 0.98 1.09  CALCIUM 8.9 9.3   PT/INR No results for input(s): LABPROT, INR in the last 72 hours.   Recent Labs Lab 09/09/15 1827 09/10/15 0457  AST 29 22  ALT 62 48  ALKPHOS 89 75  BILITOT 1.0 1.0  PROT 8.2* 6.8  ALBUMIN 3.8 3.1*     Lipase  No results found for: LIPASE   Studies/Results: No results found.  Medications: . enoxaparin (LOVENOX) injection  40 mg Subcutaneous Q24H  . pantoprazole  40 mg Oral QHS  . vancomycin  125 mg Oral Q6H    Assessment/Plan S/p lap appy for acute appendicitis with serositis 09/03/15 - [place]. [place] Re-hospitalized with Colitis 09/10/15 C diff antigen positive/toxin negative 09/11/15  PCR FOR C DIFF POSITIVE FEN: Soft diet/IV fluids above ID: Zosyn/Metronidazole day 4 (discussed with pharmacy will start PO vancomycin 125 mg qid) VTE: Lovenox/SCD    Plan:  Home today on 13 more days of oral vancomycin.  Follow up with your PRIMARY CARE for the  C diff after the antibiotics are completed.  Follow up in the clinic for post op appendectomy as scheduled.       LOS: 3 days    Zarie Kosiba 09/13/2015 [phone]

## 2015-09-13 NOTE — Discharge Summary (Signed)
Physician Discharge Summary  Patient ID: Jesse Swanson MRN: [id number] DOB/AGE: [birth year] 30 y.o.  Admit date: 09/09/2015 Discharge date: 09/13/2015  Discharge Diagnoses:  Principal Problem:   Clostridium difficile colitis Active Problems:   Appendicitis   Colitis   PROCEDURES: none  Hospital Course: Patient is a 30 year old male who is extremely status post laparoscopic appendectomy. He did not have any perforation and had laparoscopic cholecystectomy by Dr. Dwain Sarna on 09/03/2015. The patient was doing much better until Monday 2 days ago. He started to fill some increased abdominal pain. He also started having fevers on Thursday. Friday morning his fever had gone up as high doing 102.1. Because of the increased abdominal discomfort he was having and his fever, any color office. CBC was ordered. This was elevated to 20. He was sent to the emergency department to rule out an abscess with a CT scan. He remains feeling very bad. He has some nausea. He also feels very weak and is quite sore on the right side. He also reports some soreness on the left lateral abdomen.  He was seen in the ED and admitted by Dr. Donell Beers, she started him on antibiotics.  C diff screening was performed and he was found to be antigen positive and toxin negative.  PCR was positive and pt was converted from IV Zosyn and Flagyl to oral Vancomycin.  He is doing much better, his stools are returning to normal and the number of stools have decreased.   He will complete a 14 day course of Oral vancomycin 125 mg PO q6h.   CBC Latest Ref Rng 09/13/2015 09/12/2015 09/11/2015  WBC 4.0 - 10.5 K/uL 9.1 8.8 9.7  Hemoglobin 13.0 - 17.0 g/dL 95.6 21.3 08.6  Hematocrit 39.0 - 52.0 % 42.3 42.1 42.0  Platelets 150 - 400 K/uL 360 340 272   CMP Latest Ref Rng 09/12/2015 09/11/2015 09/10/2015  Glucose 65 - 99 mg/dL 92 99 578(I)  BUN 6 - 20 mg/dL 6 <6(N) 7  Creatinine 6.29 - 1.24 mg/dL 5.28 4.13 2.44  Sodium 135 - 145 mmol/L 138 136  133(L)  Potassium 3.5 - 5.1 mmol/L 3.7 3.9 3.8  Chloride 101 - 111 mmol/L 102 104 104  CO2 22 - 32 mmol/L Calcium 8.9 - 10.3 mg/dL 9.3 8.9 0.1(U)  Total Protein 6.5 - 8.1 g/dL - - 6.8  Total Bilirubin 0.3 - 1.2 mg/dL - - 1.0  Alkaline Phos 38 - 126 U/L - - 75  AST 15 - 41 U/L - - 22  ALT 17 - 63 U/L - - 48   by pcr          POSITIVE    Toxigenic C Difficile by pcr     C Diff interpretation          C. dif...    C Diff interpretation    C Diff toxin          NEGATIVE    C Diff toxin    C Diff antigen          POSITIVE            Condition on d/c:  Improved        Disposition: 01-Home or Self Care     Medication List    TAKE these medications        ibuprofen 200 MG tablet  Commonly known as:  ADVIL,MOTRIN  Take 200 mg by mouth.     oxyCODONE 5 MG immediate release tablet  Commonly known as:  Oxy IR/ROXICODONE  Take 1-2 tablets (5-10 mg total) by mouth every 4 (four) hours as needed for moderate pain.     saccharomyces boulardii 250 MG capsule  Commonly known as:  FLORASTOR  You can buy this at any drug store, and use as directed for at least a month.     vancomycin 50 mg/mL oral solution  Commonly known as:  VANCOCIN  Take 2.5 mLs (125 mg total) by mouth every 6 (six) hours.       Follow-up Information    Follow up with Lolita PatellaEADE,ROBERT ALEXANDER, MD.   Specialty:  Family Medicine   Why:  Call for follow up after you finish antibiotics.   Contact information:   [address] [place] [address] [phone]       Follow up with [facility].   Specialty:  General Surgery   Why:  Keep your current appointment.  Let them know you are being treated for C diff.   Contact information:   [address] [place] [address] [phone]       Signed: Sherrie GeorgeJENNINGS,Telissa Palmisano 09/13/2015, 11:08 AM

## 2015-09-13 NOTE — Progress Notes (Signed)
Pt. Stated he took a shower during the night and he will do the same tonight. Set up everything Pt will need once he is ready to take a shower.

## 2015-09-13 NOTE — Care Management (Signed)
Benefit check on Vancomycin :  Pt copay will be $1,318.71. Pt has a deductible of $2,000 pt has only met [phone]- after the deductible has been met pt will be responsible for 20 % of the drug cost  Went to discuss with patient and he has already left. Paged Will with [facility].  Magdalen Spatz RN BSN [phone]

## 2015-09-13 NOTE — Care Management Note (Signed)
Case Management Note  Patient Details  Name: Forestine NaCaleb F Mirza MRN: [id number] Date of Birth: [birth year]  Subjective/Objective:                    Action/Plan: Placed benefits check for Vancomycin 125 mg PO QID x 13 days . Will with [facility]. Expected Discharge Date:                  Expected Discharge Plan:  Home/Self Care  In-House Referral:     Discharge planning Services     Post Acute Care Choice:    Choice offered to:     DME Arranged:    DME Agency:     HH Arranged:    HH Agency:     Status of Service:  In process, will continue to follow  Medicare Important Message Given:    Date Medicare IM Given:    Medicare IM give by:    Date Additional Medicare IM Given:    Additional Medicare Important Message give by:     If discussed at Long Length of Stay Meetings, dates discussed:    Additional Comments:  Kingsley PlanWile, Korde Jeppsen Marie, RN 09/13/2015, 10:25 AM

## 2015-09-13 NOTE — Progress Notes (Signed)
Jesse Swanson to be D/C'd  per MD order. Discussed with the patient and all questions fully answered.  VSS, Skin clean, dry and intact without evidence of skin break down, no evidence of skin tears noted.  IV catheter discontinued intact. Site without signs and symptoms of complications. Dressing and pressure applied.  An After Visit Summary was printed and given to the patient. Patient received prescription.  D/c education completed with patient/family including follow up instructions, medication list, d/c activities limitations if indicated, with other d/c instructions as indicated by MD - patient able to verbalize understanding, all questions fully answered.   Patient instructed to return to ED, call 911, or call MD for any changes in condition.   Patient to be escorted via WC, and D/C home via private auto.

## 2016-12-04 IMAGING — DX DG CHEST 2V
2 series · 2 of 2 positions shown · non-contrast
Comparison: 06/08/2013; 09/03/2015

CLINICAL DATA: Appendectomy 7 days ago. Fever for 3 days. Cough for
2 weeks. Shortness of breath.

EXAM:
CHEST  2 VIEW

[w chest pa]
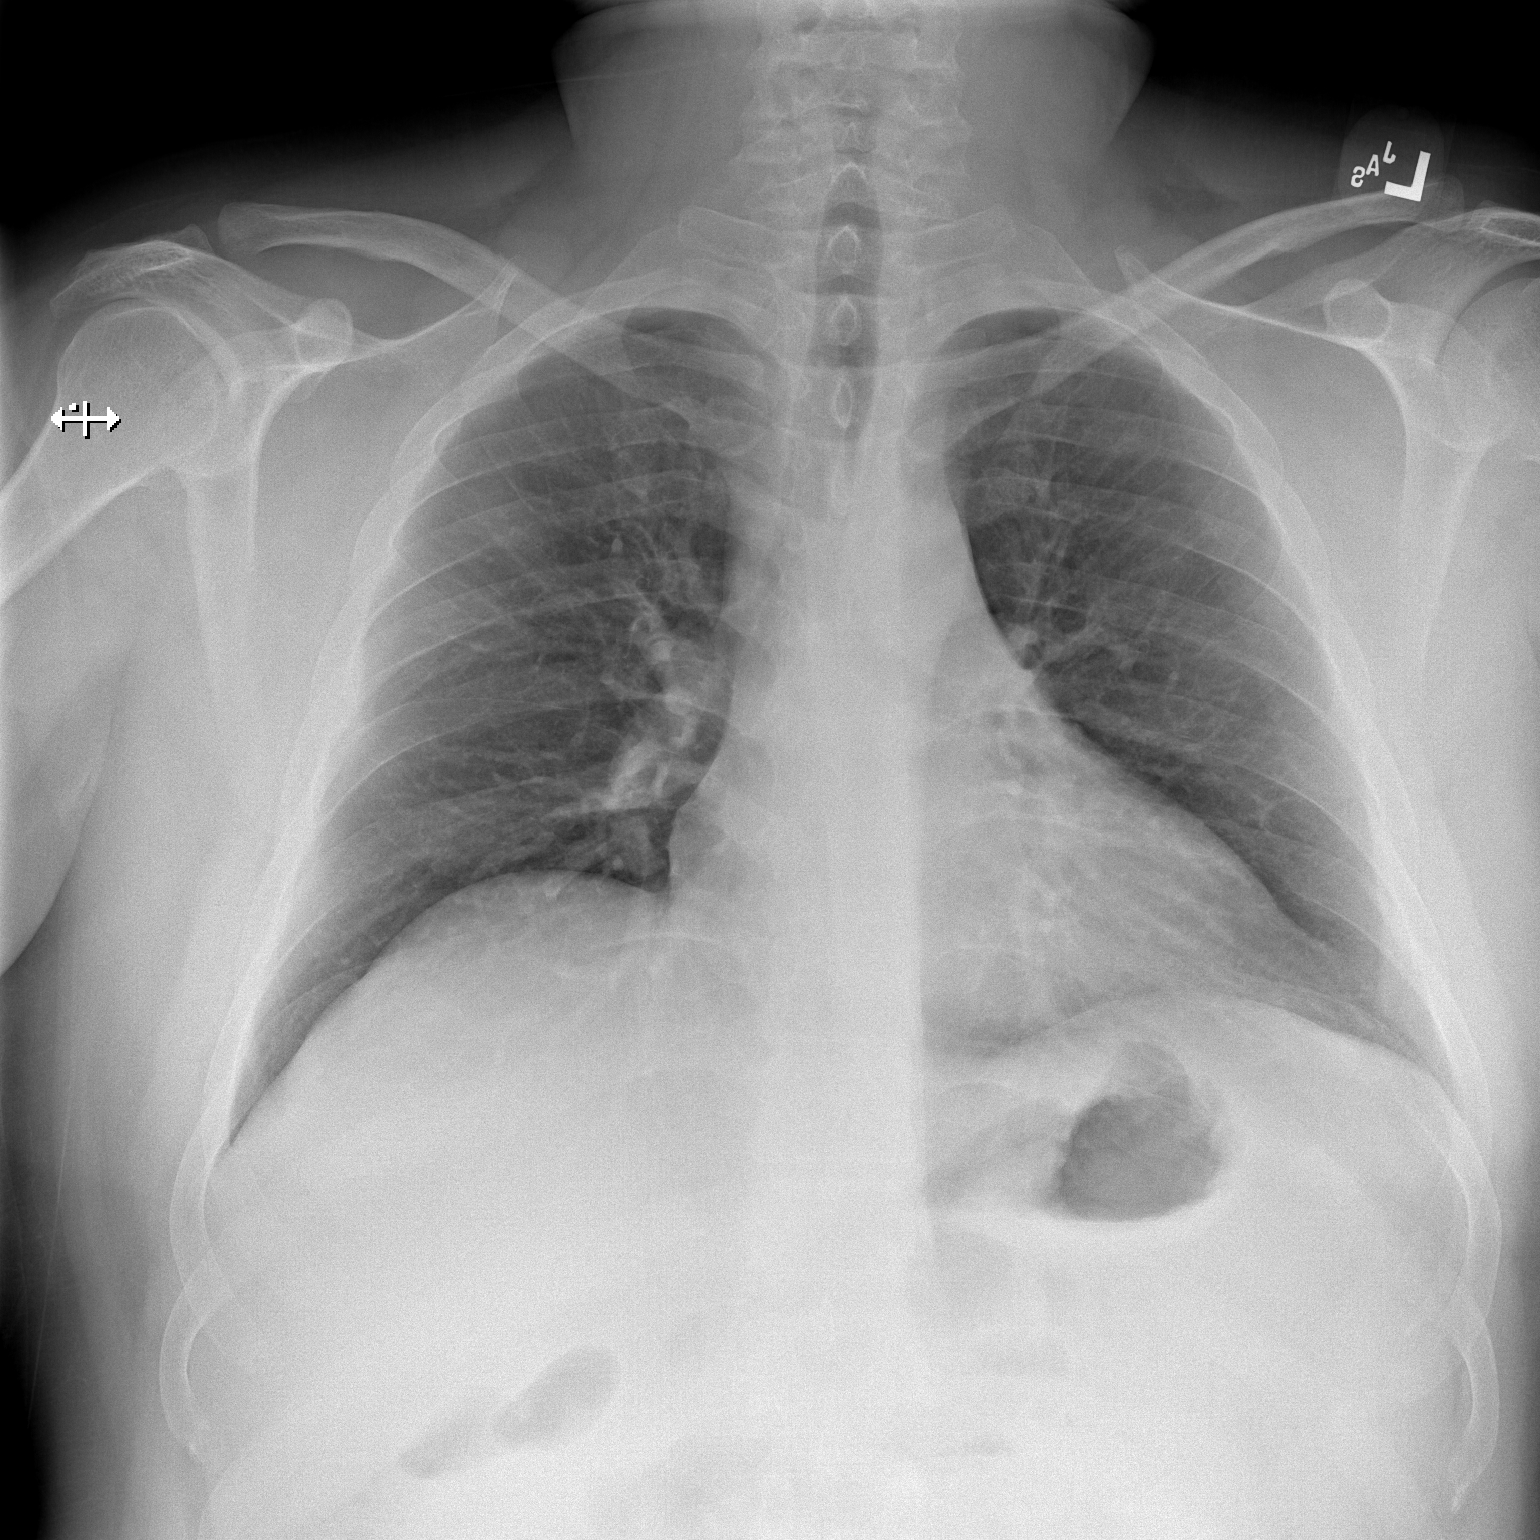

[w chest lat]
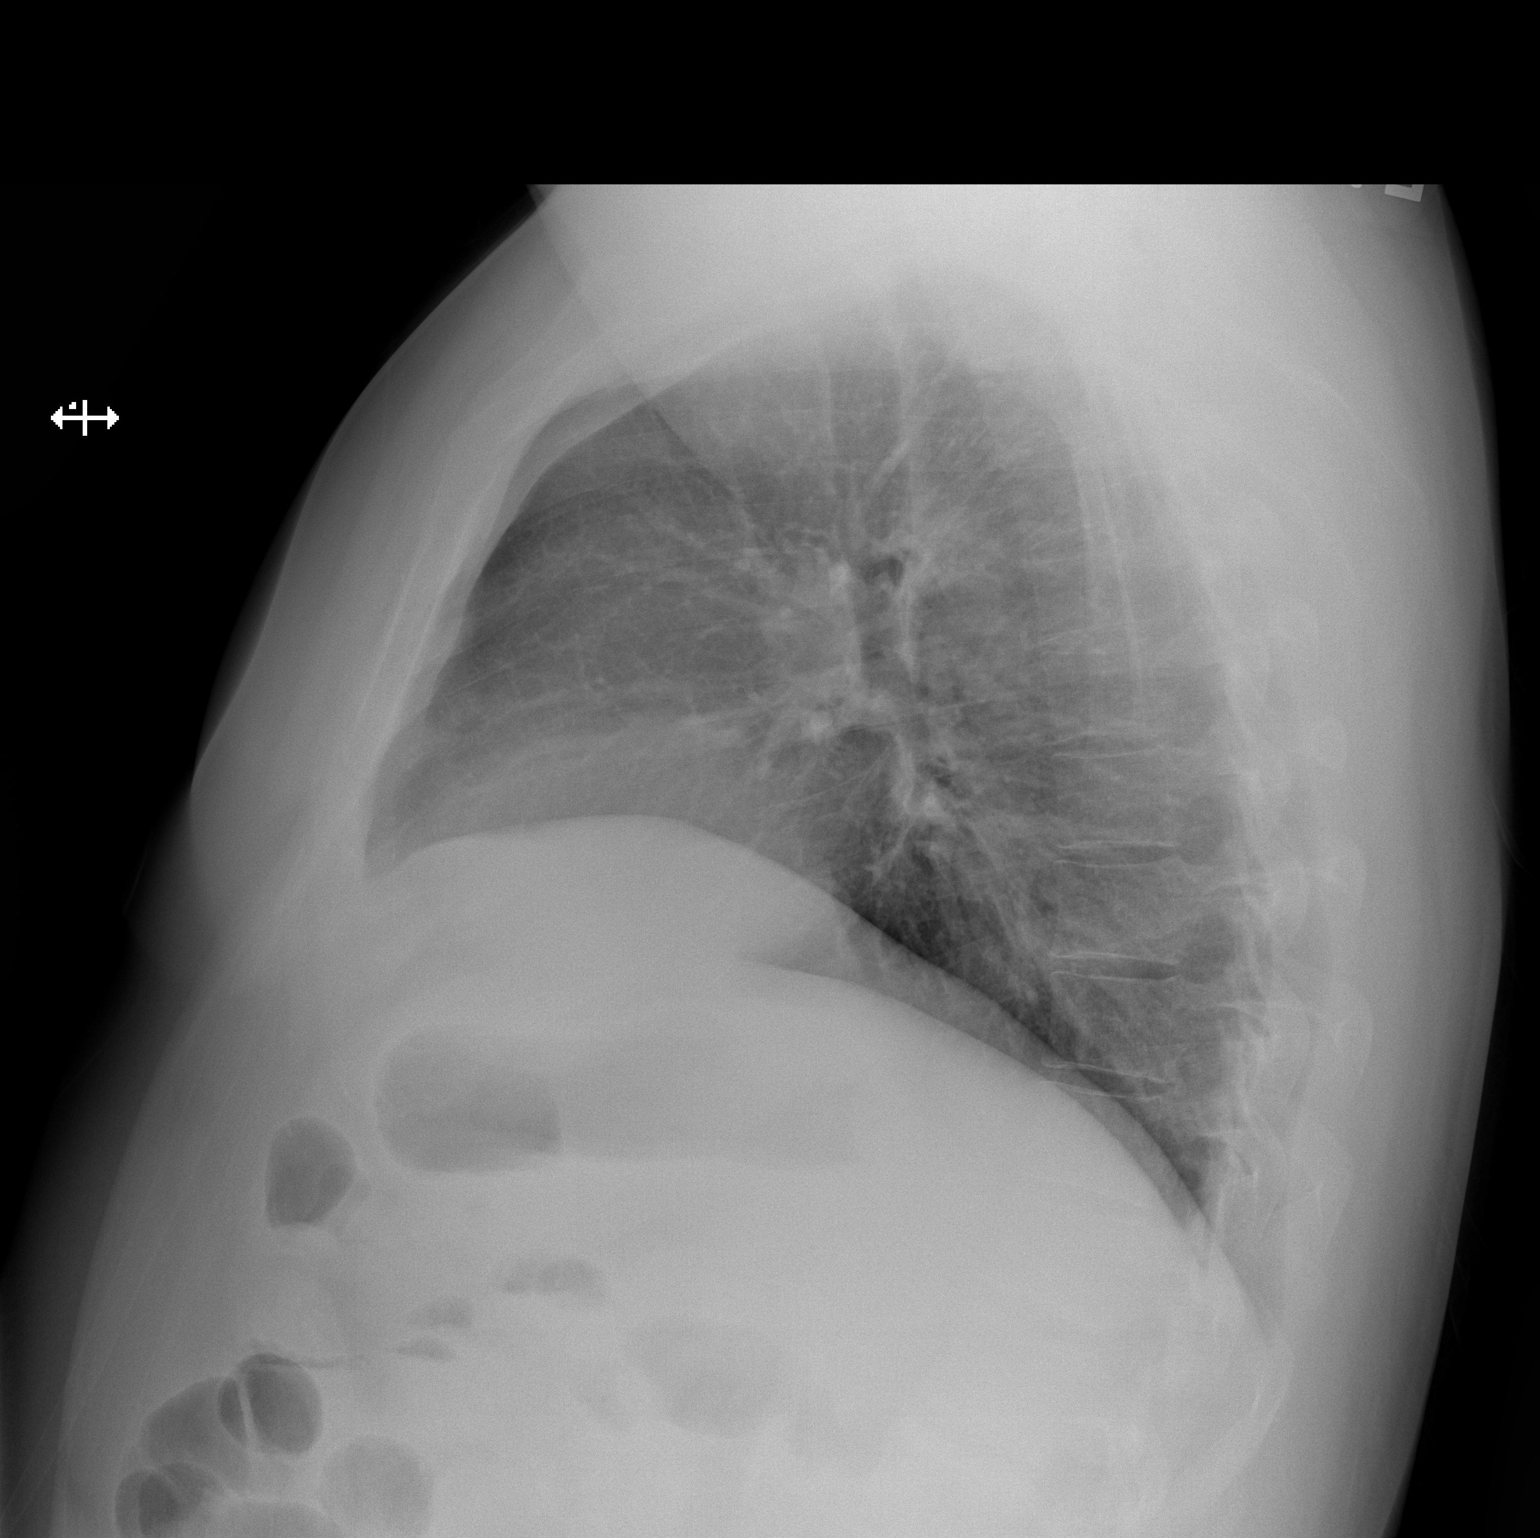

[2 of 2 positions shown; findings below may reference images not displayed]

FINDINGS: The heart size and mediastinal contours are within normal limits.
Both lungs are clear. The visualized skeletal structures are
unremarkable.
IMPRESSION: No active cardiopulmonary disease.

## 2016-12-05 IMAGING — CT CT ABD-PELV W/ CM
2 of 5 series · 9 of 46 positions shown, 10 images · IV contrast (Iodine)
Comparison: Preoperative CT 09/03/2015

CLINICAL DATA: Appendectomy last week, abdominal pain, fever, and
diarrhea. Elevated white blood cell count.

EXAM:
CT ABDOMEN AND PELVIS WITH CONTRAST
TECHNIQUE: Multidetector CT imaging of the abdomen and pelvis was performed
using the standard protocol following bolus administration of
intravenous contrast.
CONTRAST:  100 cc Fsovue-KZZ IV

[Series 201: routine, idose (2) · axial · 0.85mm/px · z∈[-978,-568]mm · 6 of 102 slices shown, 7 images]
[im 10/102  soft-tissue]
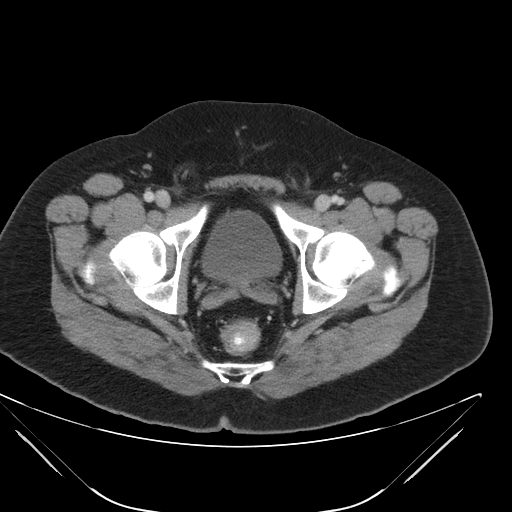
[im 10/102  bone]
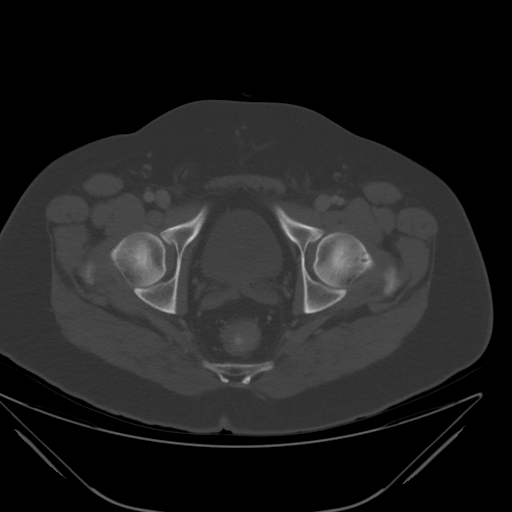
[im 29/102  soft-tissue]
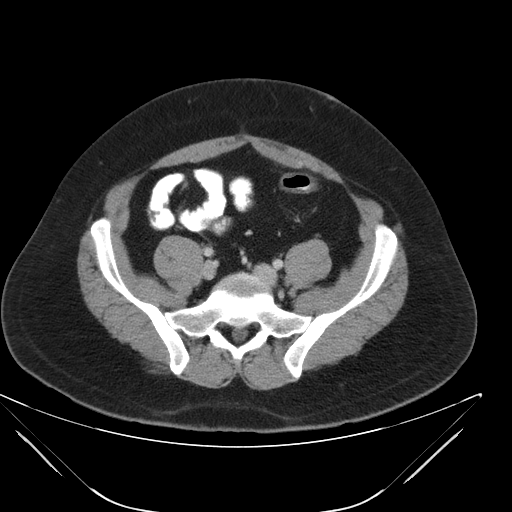
[im 44/102  soft-tissue]
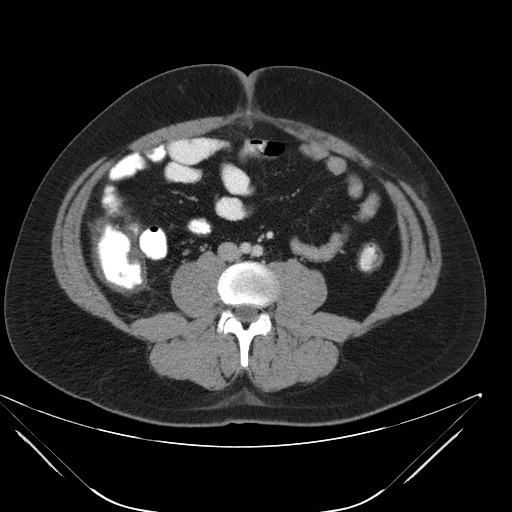
[im 58/102  soft-tissue]
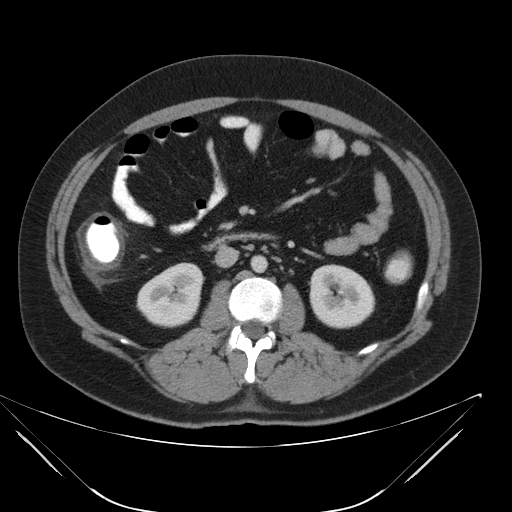
[im 77/102  soft-tissue]
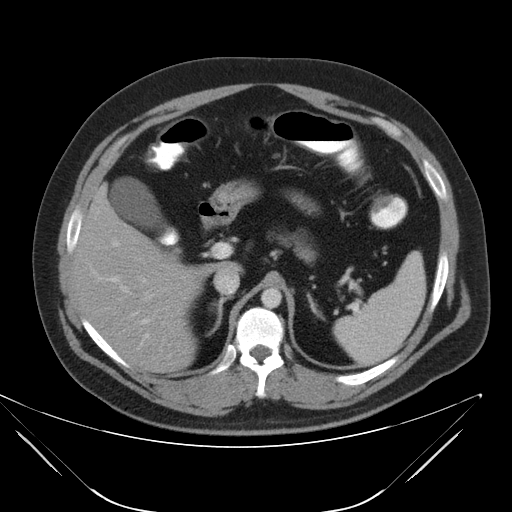
[im 92/102  soft-tissue]
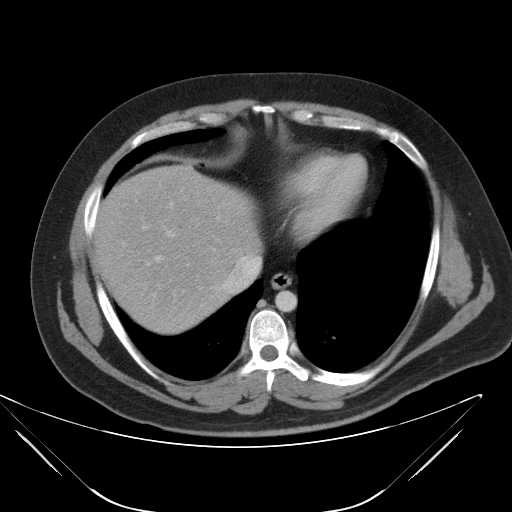

[Series 203: coronals, idose (2) · coronal · 0.45mm/px · 3 of 128 slices shown]
[im 43/128  soft-tissue]
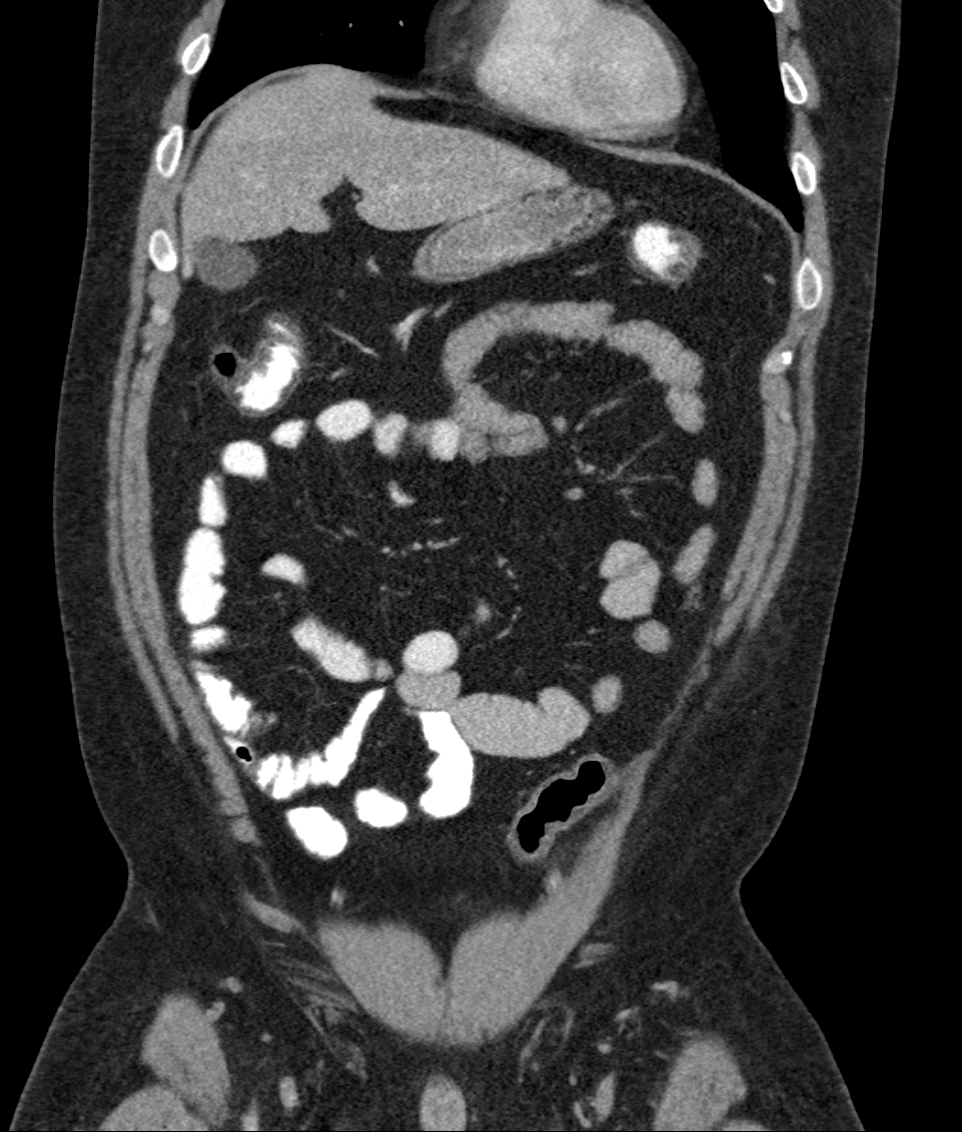
[im 57/128  soft-tissue]
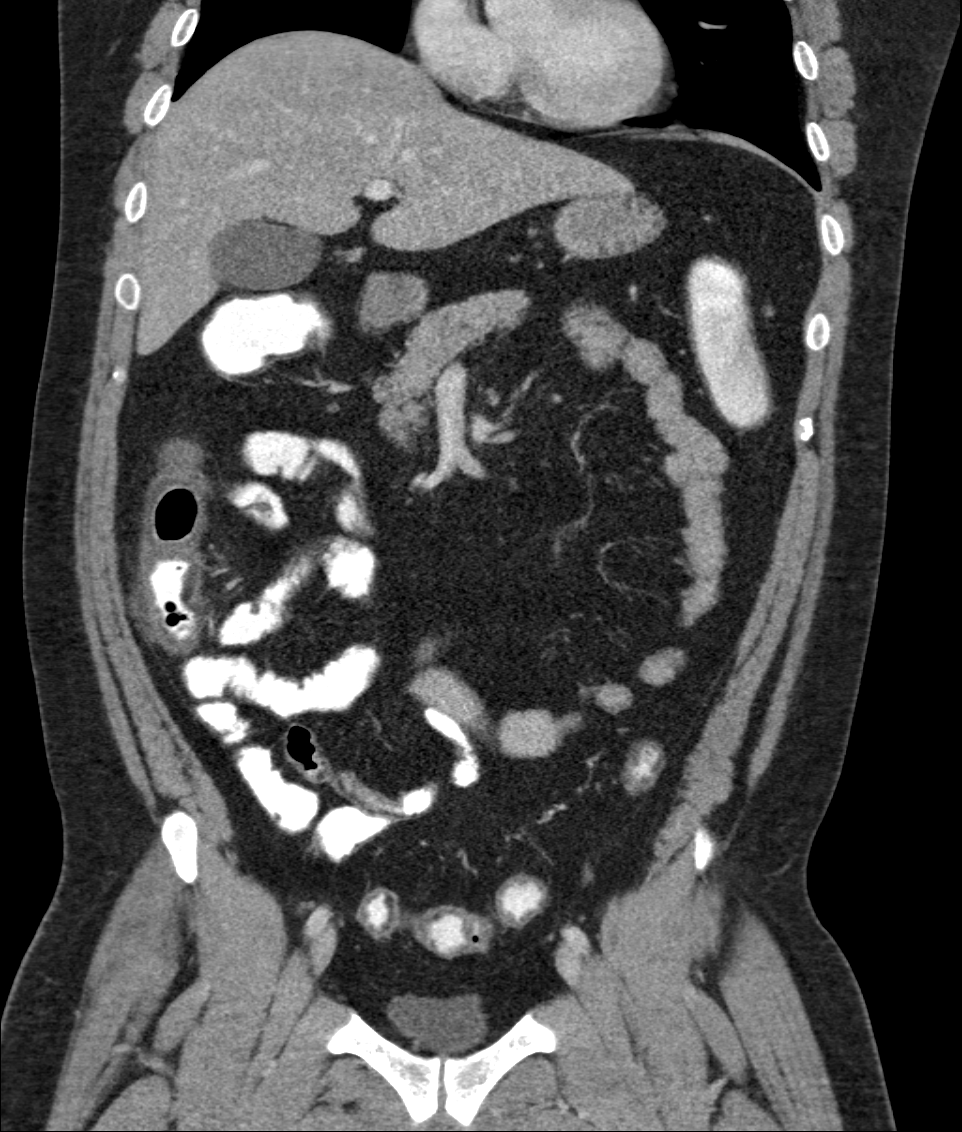
[im 71/128  soft-tissue]
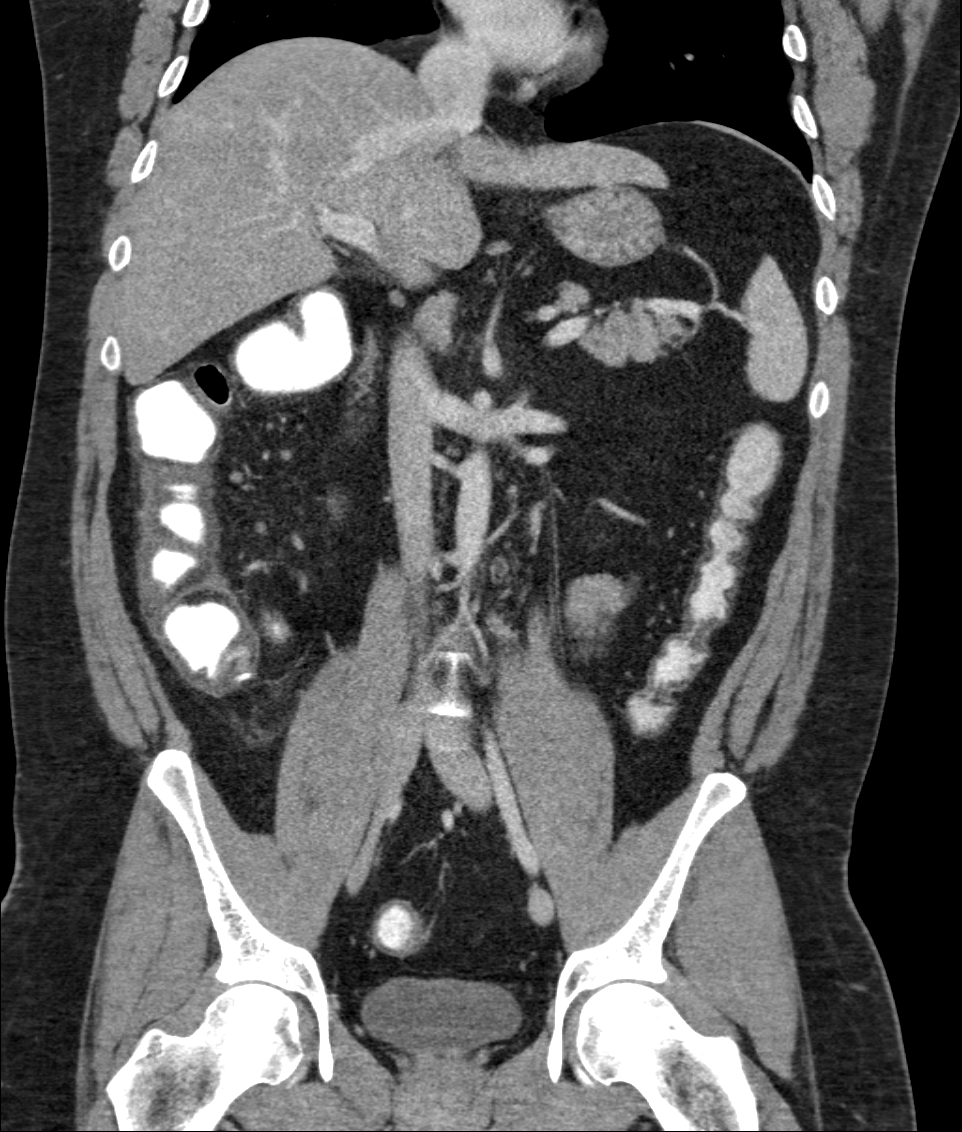

[9 of 46 positions shown; findings below may reference images not displayed]

FINDINGS: Lower chest: The included lung bases are clear. No pleural effusion.

Liver: No focal lesion.

Hepatobiliary: Gallbladder physiologically distended, no calcified
stone. No biliary dilatation.

Pancreas: No ductal dilatation or inflammation.

Spleen: Normal.

Adrenal glands: No nodule.

Kidneys: Symmetric renal enhancement and excretion. No
hydronephrosis. There are 2 right renal arteries.

Stomach/Bowel: Stomach physiologically distended. There are no
dilated or thickened small bowel loops. Colonic wall thickening
about the cecum and ascending colon. No additional colonic wall
thickening. Post appendectomy with clips at the base of the cecum.
No bowel obstruction, oral contrast throughout the large and small
bowel.

Vascular/Lymphatic: No retroperitoneal adenopathy. Prominent right
lower quadrant mesenteric lymph nodes. Abdominal aorta is normal in
caliber.

Reproductive: Normal prostate gland.

Bladder: Minimally distended, no wall thickening.

Other: Tiny locular of air anterior to the liver, likely
postsurgical. Minimal fluid in the right pericolic gutter with soft
tissue stranding. No loculated fluid collection or abscess. Fat
within both inguinal canals.

Musculoskeletal: There are no acute or suspicious osseous
abnormalities.
IMPRESSION: 1. No evidence of abscess post appendectomy.
2. Mild colonic wall thickening involving the ascending colon with
adjacent soft tissue stranding and trace pericolic fluid. Findings
suggest colitis, may be infectious or inflammatory. There are
multiple prominent lymph nodes in the ileocolic chain, likely
reactive.

## 2017-02-22 ENCOUNTER — Other Ambulatory Visit: Payer: Self-pay | Admitting: Physician Assistant

## 2017-02-22 DIAGNOSIS — R1901 Right upper quadrant abdominal swelling, mass and lump: Secondary | ICD-10-CM
